# Patient Record
Sex: Female | Born: 1975 | Race: White | Hispanic: No | Marital: Married | Smoking: Never smoker
Health system: Southern US, Community
[De-identification: ages and names within clinical notes are randomized; demographics above are authoritative.]

## PROBLEM LIST (undated history)

## (undated) DIAGNOSIS — F909 Attention-deficit hyperactivity disorder, unspecified type: Secondary | ICD-10-CM

## (undated) DIAGNOSIS — L509 Urticaria, unspecified: Secondary | ICD-10-CM

## (undated) HISTORY — PX: APPENDECTOMY: SHX54

## (undated) HISTORY — DX: Urticaria, unspecified: L50.9

## (undated) HISTORY — DX: Attention-deficit hyperactivity disorder, unspecified type: F90.9

---

## 1998-10-09 ENCOUNTER — Other Ambulatory Visit: Admission: RE | Admit: 1998-10-09 | Discharge: 1998-10-09 | Payer: Self-pay | Admitting: Obstetrics and Gynecology

## 1998-11-24 ENCOUNTER — Encounter (INDEPENDENT_AMBULATORY_CARE_PROVIDER_SITE_OTHER): Payer: Self-pay | Admitting: Specialist

## 1998-11-24 ENCOUNTER — Ambulatory Visit (HOSPITAL_COMMUNITY): Admission: RE | Admit: 1998-11-24 | Discharge: 1998-11-24 | Payer: Self-pay | Admitting: Obstetrics and Gynecology

## 1999-12-06 ENCOUNTER — Other Ambulatory Visit: Admission: RE | Admit: 1999-12-06 | Discharge: 1999-12-06 | Payer: Self-pay | Admitting: Gynecology

## 1999-12-17 ENCOUNTER — Inpatient Hospital Stay (HOSPITAL_COMMUNITY): Admission: AD | Admit: 1999-12-17 | Discharge: 1999-12-17 | Payer: Self-pay | Admitting: Obstetrics and Gynecology

## 2000-09-23 ENCOUNTER — Inpatient Hospital Stay (HOSPITAL_COMMUNITY): Admission: AD | Admit: 2000-09-23 | Discharge: 2000-09-23 | Payer: Self-pay | Admitting: Obstetrics & Gynecology

## 2000-12-09 ENCOUNTER — Inpatient Hospital Stay (HOSPITAL_COMMUNITY): Admission: AD | Admit: 2000-12-09 | Discharge: 2000-12-09 | Payer: Self-pay | Admitting: Obstetrics & Gynecology

## 2007-11-23 ENCOUNTER — Inpatient Hospital Stay (HOSPITAL_COMMUNITY): Admission: AD | Admit: 2007-11-23 | Discharge: 2007-11-23 | Payer: Self-pay | Admitting: Obstetrics and Gynecology

## 2007-11-24 ENCOUNTER — Inpatient Hospital Stay (HOSPITAL_COMMUNITY): Admission: AD | Admit: 2007-11-24 | Discharge: 2007-11-24 | Payer: Self-pay | Admitting: Obstetrics and Gynecology

## 2007-11-28 ENCOUNTER — Ambulatory Visit (HOSPITAL_COMMUNITY): Admission: RE | Admit: 2007-11-28 | Discharge: 2007-11-28 | Payer: Self-pay | Admitting: Obstetrics and Gynecology

## 2007-12-05 ENCOUNTER — Ambulatory Visit (HOSPITAL_COMMUNITY): Admission: RE | Admit: 2007-12-05 | Discharge: 2007-12-05 | Payer: Self-pay | Admitting: Obstetrics and Gynecology

## 2008-01-29 ENCOUNTER — Inpatient Hospital Stay (HOSPITAL_COMMUNITY): Admission: AD | Admit: 2008-01-29 | Discharge: 2008-01-30 | Payer: Self-pay | Admitting: Obstetrics and Gynecology

## 2008-04-15 ENCOUNTER — Inpatient Hospital Stay (HOSPITAL_COMMUNITY): Admission: AD | Admit: 2008-04-15 | Discharge: 2008-04-15 | Payer: Self-pay | Admitting: Obstetrics and Gynecology

## 2008-07-18 ENCOUNTER — Inpatient Hospital Stay (HOSPITAL_COMMUNITY): Admission: AD | Admit: 2008-07-18 | Discharge: 2008-07-21 | Payer: Self-pay | Admitting: Obstetrics and Gynecology

## 2008-07-18 ENCOUNTER — Ambulatory Visit: Payer: Self-pay | Admitting: Advanced Practice Midwife

## 2010-09-14 LAB — CBC
Hemoglobin: 10.2 g/dL — ABNORMAL LOW (ref 12.0–15.0)
Hemoglobin: 11.6 g/dL — ABNORMAL LOW (ref 12.0–15.0)
MCV: 96.3 fL (ref 78.0–100.0)
RBC: 3.15 MIL/uL — ABNORMAL LOW (ref 3.87–5.11)
RBC: 3.6 MIL/uL — ABNORMAL LOW (ref 3.87–5.11)
RDW: 12.8 % (ref 11.5–15.5)
WBC: 10.8 10*3/uL — ABNORMAL HIGH (ref 4.0–10.5)
WBC: 13.3 10*3/uL — ABNORMAL HIGH (ref 4.0–10.5)

## 2010-10-12 NOTE — Discharge Summary (Signed)
Tammy Richard, Tammy Richard              ACCOUNT NO.:  [id number]   MEDICAL RECORD NO.:  [id number]          PATIENT TYPE:  INP   LOCATION:  9103                          [facility]ILITY[facility]   PHYSICIAN:  Gerrit Friends. Aldona Bar, M.D.   DATE OF BIRTH:  [birth year]   DATE OF ADMISSION:  07/18/2008  DATE OF DISCHARGE:  07/21/2008                               DISCHARGE SUMMARY   DISCHARGE DIAGNOSES:  1. Term pregnancy, delivered 8 pounds 5 ounces female infant, Apgars 9      and 9.  2. Blood type  .  3. Induction of labor.   PROCEDURES:  Normal spontaneous delivery.   SUMMARY:  This 35 year old gravida 5, para 2 was admitted at 33 weeks'  and 6 days for induction of labor by Dr. Dareen Piano.  She delivered a  normal spontaneous delivery on July 19, 2008 and subsequently had a  benign postpartum course.  Discharge hemoglobin 10.2, white count  13,300, and platelet count 138,000.  On the morning of July 21, 2008, she was ambulating well, tolerating a regular diet well, having  normal bowel and bladder function, was breast-feeding without  difficulty, and was desirous of discharge.  Accordingly, she was given  all appropriate instructions and understood all instructions well.   DISCHARGE MEDICATIONS:  1. Advil or Tylenol as needed.  2. She will continue her vitamins as long as she is breast feeding and      use Feosol capsules - one daily until return to the office in 4      weeks' time to see Dr. Dareen Piano.   At the time of discharge.  Baby was not circumcised and therefore was  circumcised immediately prior to discharge.   CONDITION ON DISCHARGE:  Improved.       Gerrit Friends. Aldona Bar, M.D.  Electronically Signed     RMW/MEDQ  D:  07/21/2008  T:  07/21/2008  Job:  [id number]

## 2010-10-15 NOTE — Op Note (Signed)
NAMEALYSHA, DOOLAN NO.:  [id number]   MEDICAL RECORD NO.:  [id number]          PATIENT TYPE:  INP   LOCATION:  9103                          [facility]   PHYSICIAN:  Cam Hai, C.N.M.  DATE OF BIRTH:  22-Sep-1975   DATE OF PROCEDURE:  07/23/2008  DATE OF DISCHARGE:  07/21/2008                               OPERATIVE REPORT   Ms. Solana is a 35 year old gravida 5, para 2 white female in active  labor on July 01, 2008.  She was progressing rapidly with the  possibility that her attending physician may not make the delivery,  therefore my presence was requested by the nurse caring for the patient.  Upon entering the room, the patient was crowning after a couple times of  uncontrollable pushing.  She progressed to normal spontaneous vaginal  birth of a viable female infant, Apgars 9 at 1 minute 9 at 5 minutes,  weight of 8 pounds and 5 ounces.  The infant was lifted to the patient's  abdomen.  Cord was clamped and cut by the father of the baby.  Cord  blood was collected.  Placenta delivered spontaneous and intact with a 3-  vessel cord.  The fundus was firm with normal lochia.  Perineal  inspection revealed no lacerations.  Estimated blood loss was less than  500 mL.  The patient and baby were stable upon my departure of the room.  At the request of Dr. Dareen Piano, these events were not recorded in the  patient's chart and therefore has been dictated.      Cam Hai, C.N.M.     KS/MEDQ  D:  07/23/2008  T:  07/24/2008  Job:  [id number]

## 2011-02-24 LAB — CBC
HCT: 40.5
MCHC: 34.1
MCV: 93.2
Platelets: 256
WBC: 15.5 — ABNORMAL HIGH

## 2011-02-24 LAB — URINE MICROSCOPIC-ADD ON

## 2011-02-24 LAB — URINALYSIS, ROUTINE W REFLEX MICROSCOPIC
Glucose, UA: NEGATIVE
Protein, ur: NEGATIVE
Specific Gravity, Urine: 1.025

## 2011-02-24 LAB — URINE CULTURE

## 2011-03-02 LAB — WET PREP, GENITAL: Trich, Wet Prep: NONE SEEN

## 2011-03-02 LAB — URINALYSIS, ROUTINE W REFLEX MICROSCOPIC
Leukocytes, UA: NEGATIVE
Nitrite: NEGATIVE
Protein, ur: NEGATIVE
Urobilinogen, UA: 0.2

## 2011-03-02 LAB — URINE MICROSCOPIC-ADD ON

## 2014-09-15 ENCOUNTER — Encounter (HOSPITAL_BASED_OUTPATIENT_CLINIC_OR_DEPARTMENT_OTHER): Payer: Self-pay

## 2014-09-15 ENCOUNTER — Emergency Department (HOSPITAL_BASED_OUTPATIENT_CLINIC_OR_DEPARTMENT_OTHER)
Admission: EM | Admit: 2014-09-15 | Discharge: 2014-09-15 | Disposition: A | Discharge status: Home / Self Care | Payer: Worker's Compensation | Attending: Emergency Medicine | Admitting: Emergency Medicine

## 2014-09-15 DIAGNOSIS — Y998 Other external cause status: Secondary | ICD-10-CM | POA: Diagnosis not present

## 2014-09-15 DIAGNOSIS — Y9389 Activity, other specified: Secondary | ICD-10-CM | POA: Insufficient documentation

## 2014-09-15 DIAGNOSIS — Y92410 Unspecified street and highway as the place of occurrence of the external cause: Secondary | ICD-10-CM | POA: Insufficient documentation

## 2014-09-15 DIAGNOSIS — Z880 Allergy status to penicillin: Secondary | ICD-10-CM | POA: Insufficient documentation

## 2014-09-15 DIAGNOSIS — M62838 Other muscle spasm: Secondary | ICD-10-CM | POA: Insufficient documentation

## 2014-09-15 DIAGNOSIS — S3992XA Unspecified injury of lower back, initial encounter: Secondary | ICD-10-CM | POA: Diagnosis present

## 2014-09-15 MED ORDER — PREDNISONE 50 MG PO TABS
60.0000 mg | ORAL_TABLET | Freq: Once | ORAL | Status: AC
  Administered 2014-09-15: 60 mg via ORAL
  Filled 2014-09-15 (×2): qty 1

## 2014-09-15 MED ORDER — METHOCARBAMOL 500 MG PO TABS: 1000.0000 mg | ORAL_TABLET | Freq: Four times a day (QID) | ORAL | Status: DC | PRN

## 2014-09-15 MED ORDER — METHOCARBAMOL 500 MG PO TABS
1000.0000 mg | ORAL_TABLET | Freq: Once | ORAL | Status: AC
  Administered 2014-09-15: 1000 mg via ORAL
  Filled 2014-09-15: qty 2

## 2014-09-15 MED ORDER — PREDNISONE 50 MG PO TABS: ORAL_TABLET | ORAL | Status: DC
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
## 2014-09-15 NOTE — Discharge Instructions (Signed)
You can also take  tylenol (acetaminophen)  (this is 3 over the counter pills) four times a day. Do not drink alcohol or combine with other medications that have acetaminophen as an ingredient (Read the labels!).    For breakthrough pain you may take Robaxin. Do not drink alcohol, drive or operate heavy machinery when taking Robaxin.  Do not hesitate to return to the emergency room for any new, worsening or concerning symptoms.  Please obtain primary care using resource guide below. But the minute you were seen in the emergency room and that they will need to obtain records for further outpatient management.   Muscle Cramps and Spasms Muscle cramps and spasms are when muscles tighten by themselves. They usually get better within minutes. Muscle cramps are painful. They are usually stronger and last longer than muscle spasms. Muscle spasms may or may not be painful. They can last a few seconds or much longer. HOME CARE  Drink enough fluid to keep your pee (urine) clear or pale yellow.  Massage, stretch, and relax the muscle.  Use a warm towel, heating pad, or warm shower water on tight muscles.  Place ice on the muscle if it is tender or in pain.  Put ice in a plastic bag.  Place a towel between your skin and the bag.  Leave the ice on for 15-20 minutes, 03-04 times a day.  Only take medicine as told by your doctor. GET HELP RIGHT AWAY IF:  Your cramps or spasms get worse, happen more often, or do not get better with time. MAKE SURE YOU:  Understand these instructions.  Will watch your condition.  Will get help right away if you are not doing well or get worse. Document Released: 04/28/2008 Document Revised: 09/10/2012 Document Reviewed: 05/02/2012 [facility] Patient Information 2015 [place], [place]. This information is not intended to replace advice given to you by your health care provider. Make sure you discuss any questions you have with your health care  provider.  Emergency Department Resource Guide 1) Find a Doctor and Pay Out of Pocket Although you won't have to find out who is covered by your insurance plan, it is a good idea to ask around and get recommendations. You will then need to call the office and see if the doctor you have chosen will accept you as a new patient and what types of options they offer for patients who are self-pay. Some doctors offer discounts or will set up payment plans for their patients who do not have insurance, but you will need to ask so you aren't surprised when you get to your appointment.  2) Contact Your Local Health Department Not all health departments have doctors that can see patients for sick visits, but many do, so it is worth a call to see if yours does. If you don't know where your local health department is, you can check in your phone book. The CDC also has a tool to help you locate your state's health department, and many state websites also have listings of all of their local health departments.  3) Find a Walk-in Clinic If your illness is not likely to be very severe or complicated, you may want to try a walk in clinic. These are popping up all over the country in pharmacies, drugstores, and shopping centers. They're usually staffed by nurse practitioners or physician assistants that have been trained to treat common illnesses and complaints. They're usually fairly quick and inexpensive. However, if you have serious medical  issues or chronic medical problems, these are probably not your best option.  No Primary Care Doctor: - Call Health Connect at  [phone] - they can help you locate a primary care doctor that  accepts your insurance, provides certain services, etc. - Physician Referral Service- [phone]  Chronic Pain Problems: Organization         Address  Phone   Notes  [facility]  [phone] Patients need to be referred by their primary care doctor.   Medication  Assistance: Organization         Address  Phone   Notes  [facility] Medication [facility] [address] [place] [place], [place] [address] [phone] --Must be a resident of [facility] -- Must have NO insurance coverage whatsoever (no Medicaid/ Medicare, etc.) -- The pt. MUST have a primary care doctor that directs their care regularly and follows them in the community   [facility]  [phone]   [employer]  [phone]    Agencies that provide inexpensive medical care: Organization         Address  Phone   Notes  [facility]  [phone]   [facility]    [phone]   [facility] [address] [place], [place] [address] [phone]   [facility] [address] [place] [phone]   Planned Parenthood    [phone]   [facility]    [phone]   [facility] and [facility]  [address], [place] Phone:  [phone], Fax:  [phone] Hours of Operation:  9 am - 6 pm, M-F.  Also accepts Medicaid/Medicare and self-pay.  [facility]  [address], [place] Phone: [phone], Fax: [phone]. Hours of Operation:  8:30 am - 5:30 pm, M-F.  Also accepts Medicaid and self-pay.  [facility] [place] [address] [place] Phone: [phone]   [facility] [address] Natasha Bence [place], [place] [phone], Ext. [phone] Mondays & Thursdays: 7-9 AM.  First 15 patients are seen on a first come, first serve basis.    Medicaid-accepting [facility] Providers:  Organization         Address  Phone   Notes  [facility] [address], [place] [phone] Also accepts self-pay patients.  [facility] [address] Laurell Josephs [place], [place]  [phone]   [facility] [address], [place]  [phone]   [facility] [address] [place] [phone]   Renaye Rakers [address], [place]   [phone] Only accepts [place] Access [place] patients after they have their name applied to their card.   Self-Pay (no insurance) in [facility]:  Organization         Address  Phone   Notes  Sickle Cell Patients, [facility] [address] [place], [place] [phone]   [facility] [address] [place], [place] [phone]   [facility] [place]  [address], [place] [phone]   [facility]/Dr. Osei-Bonsu  [address], [place] or [address], [place] [phone] Phone number for both [place] and [place] locations is the  same.  [facility] [address], [place] [phone]   [facility] [address], [place] or [address] [phone]   [facility] [address] [place] [phone], phone; [phone], fax Sees patients 1st and 3rd Saturday of every month.  Must not qualify for public or private insurance (i.e. Medicaid, Medicare, [place] Health Choice, Veterans' Benefits)  Household income should be no more than 200% of the poverty level The clinic cannot treat you if you are pregnant or think you are pregnant  Sexually transmitted diseases are not treated at the clinic.    Dental Care: Organization         Address  Phone  Notes  [facility] Department of [facility] [address] [place], [place] [phone] Accepts children up to age 58 who are enrolled in [place] or [place] Health Choice; pregnant women with a Medicaid card; and children who have applied for Medicaid or [place] Health Choice, but were declined, whose parents can pay a reduced fee at time of service.  [facility]  Department of [facility]  [address], [place] [phone] Accepts children up to age 12 who are enrolled in [place] or [place] Health Choice; pregnant women with a Medicaid card; and children who have applied for Medicaid or  Health Choice, but were declined, whose parents can pay a reduced fee at time of service.  [facility]  [address] [place], [place] [phone] Patients are seen by appointment only. Walk-ins are not accepted. [facility] will see patients 43 years of age and older. Monday - Tuesday (8am-5pm) Most Wednesdays (8:30-5pm) $30 per visit, cash only  [facility] Adult Dental Access PROGRAM  [address], [facility] [phone] Patients are seen by appointment only. Walk-ins are not accepted. [facility] will see patients 7 years of age and older. One Wednesday Evening (Monthly: Volunteer Based).  $30 per visit, cash only  [employer]  [phone] for adults; Children under age 6, call Graduate Pediatric Dentistry at [phone]. Children aged 14-14, please call [phone] to request a pediatric application.  Dental services are provided in all areas of dental care including fillings, crowns and bridges, complete and partial dentures, implants, gum treatment, root canals, and extractions. Preventive care is also provided. Treatment is provided to both adults and children. Patients are selected via a lottery and there is often a waiting list.   [facility] [address] [place]  [phone] [email or web address]   [facility] [address] [place], [place] [phone], Ext. [phone] Second and Fourth Thursday of each month, opens at 6:30 AM; Clinic ends at 9 AM.  Patients are seen on a first-come first-served basis, and a limited number are seen during each clinic.   [facility]  [address] Ether Griffins [place], [place] [phone]    Eligibility Requirements You must have lived in [place], [place], or [place] counties for at least the last three months.   You cannot be eligible for state or federal sponsored [place], including [employer], [place], or [employer].   You generally cannot be eligible for healthcare insurance through your employer.    How to apply: Eligibility screenings are held every Tuesday and Wednesday afternoon from 1:00 pm until 4:00 pm. You do not need an appointment for  the interview!  [facility] [address] [place], [place] [phone]   [facility] Health Department  [phone]   [facility] Health Department  [phone]   [facility] Health Department  [phone]    Behavioral Health Resources in the Community: Intensive Outpatient Programs Organization         Address  Phone  Notes  [facility] Services [address], [place], [place] [phone]   [facility] Outpatient [address], [place], [place] [phone]   [facility] [address] [place], [place]  [phone]   [facility] [address],  [place], [place] [phone] or [phone]   Substance Abuse Resources Organization         Address  Phone  Notes  [facility] Drug Services  [phone]   [facility]  [phone]   [facility] [place]  [phone]   Floydene Flock  [phone]   Residential & Outpatient Substance Abuse Program  3013960596   Psychological Services Organization         Address  Phone  Notes  [facility]  [phone]   [facility] Services  [phone]   [facility] Mental Health [address] [place] [phone] or [phone]    Mobile Crisis Teams Organization         Address  Phone  Notes  [facility], Mobile Crisis Care Unit  [phone]   [facility]  [address]  [place], [place] [phone]   Doristine Locks [address] [place] [phone]    Self-Help/Support Groups Organization         Address  Phone             Notes  [facility] [place] - variety of support groups  336- I7437963 Call for more information  Narcotics Anonymous (NA), [facility] [address], [employer] [place]  2 meetings at this location   Statistician         Address  Phone  Notes  [facility] [address],    [place]  [phone]   [facility]  [address], [place] [address], [place], [place] [phone]   [facility] [address] [place], [place] [phone] Admissions: 8am-3pm M-F  [facility] [address]    [place], [place] [phone]   [facility] [address] [place], [place], [place] [phone]   [facility] [address],  [place], [place] [phone]   [facility] - Intensive Outpatient [address] Laurell Joseph[address], [place], [place] [phone]   [facility] ([facility] [address] [place]  [place], [place] [phone] or 9067212322   [facility] ([facility]) [address] [place], [place] [phone] Accepts Medicaid  [facility] [address],  [place] [phone] Substance Abuse/Addiction Treatment   [facility] Organization         Address  Phone  Notes  [facility]  [phone]   Angie Fava, PhD [address] Ervin [place], [place]   [phone] or [phone]   [facility] Behavioral   [address] [place], [place] [phone]   [facility] [address], [place], [place] [phone] Insurance/Medicaid/sponsorship through [employer] and Families [address]  [place], [place] [phone](336) [phone] Therapy/tele-psych/case    [facility] [address]   [place], [place] [phone]) [phone]    Dr. Lolly MustacheArfeen  [phone](336) [phone]   [facility]  [facility] 1) [address], [place] 2) [address], [place] 3)  [address], [place] [phone]) [phone](336) [phone]  [phone](336) [phone]   [facility] Child Abuse Hotline [phone](336) [phone] or [phone](336) [phone] (After Hours)

## 2014-09-15 NOTE — ED Notes (Signed)
Patient preparing for discharge. 
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
## 2015-07-25 DIAGNOSIS — L508 Other urticaria: Secondary | ICD-10-CM | POA: Insufficient documentation

## 2015-07-26 DIAGNOSIS — F32A Depression, unspecified: Secondary | ICD-10-CM | POA: Insufficient documentation

## 2015-07-26 DIAGNOSIS — F329 Major depressive disorder, single episode, unspecified: Secondary | ICD-10-CM | POA: Insufficient documentation

## 2015-07-26 DIAGNOSIS — F909 Attention-deficit hyperactivity disorder, unspecified type: Secondary | ICD-10-CM | POA: Insufficient documentation

## 2015-07-26 DIAGNOSIS — E7800 Pure hypercholesterolemia, unspecified: Secondary | ICD-10-CM | POA: Insufficient documentation

## 2015-07-26 DIAGNOSIS — F419 Anxiety disorder, unspecified: Secondary | ICD-10-CM | POA: Insufficient documentation

## 2015-09-15 ENCOUNTER — Ambulatory Visit (INDEPENDENT_AMBULATORY_CARE_PROVIDER_SITE_OTHER): Payer: BLUE CROSS/BLUE SHIELD | Admitting: Pediatrics

## 2015-09-15 ENCOUNTER — Encounter: Payer: Self-pay | Admitting: Pediatrics

## 2015-09-15 VITALS — BP 110/70 | HR 96 | Temp 98.5°F | Resp 16 | Ht 66.14 in | Wt 136.9 lb

## 2015-09-15 DIAGNOSIS — Q603 Renal hypoplasia, unilateral: Secondary | ICD-10-CM | POA: Diagnosis not present

## 2015-09-15 DIAGNOSIS — L501 Idiopathic urticaria: Secondary | ICD-10-CM | POA: Diagnosis not present

## 2015-09-15 NOTE — Patient Instructions (Addendum)
Continue on Xolair 2 injections once a month Zyrtec 10 mg once a day if needed for itching Have  EpiPen 0.3 mg available in case of an allergic reaction to Xolair. In that case take Benadryl 50 mg every 4 hours also Call me if the hives are not under control

## 2015-09-15 NOTE — Progress Notes (Signed)
[address] [place] [address] Dept: [phone]  New Patient Note  Patient ID: Tammy Richard, female    DOB: [birth year]  Age: 40 y.o. MRN: [id number] Date of Office Visit: 09/15/2015 Referring provider: Bosie Clos, MD [address] [place], [place] [address]    Chief Complaint: Other and Urticaria  HPI Tammy Richard presents for Treatment of chronic urticaria for 5 years. One year ago she was started on Xolair 2 injections once a month and her hives have improved dramatically. She rarely rarely has to use Zyrtec 10 mg once a day. She has been seen by Dr. Tenny Richard  In [place] but now , she wants to get her injections here in [facility].. She has been approved for Xolair injections for the next 12 months  Review of Systems  Constitutional: Negative.   HENT: Negative.   Eyes: Negative.   Respiratory: Negative.   Cardiovascular: Negative.   Gastrointestinal: Negative.   Genitourinary:       One  undeveloped kidney  Musculoskeletal: Negative.   Skin:       Hives for 6 years. No hives in the past year while on Xolair  Neurological: Negative.   Endo/Heme/Allergies: Negative.   Psychiatric/Behavioral: Negative.     Outpatient Encounter Prescriptions as of 09/15/2015  Medication Sig  . Amphetamine-Dextroamphetamine (ADDERALL PO) Take by mouth.  . EPINEPHrine (EPIPEN 2-PAK) 0.3 mg/0.3 mL IJ SOAJ injection Inject into the muscle once.  Marland Kitchen omalizumab (XOLAIR) 150 MG injection Inject 300 mg into the skin.  . valACYclovir (VALTREX) 1000 MG tablet   . methocarbamol (ROBAXIN) 500 MG tablet Take 2 tablets (1,000 mg total) by mouth 4 (four) times daily as needed (Pain). (Patient not taking: Reported on 09/15/2015)  . predniSONE (DELTASONE) 50 MG tablet Take 1 tablet daily with breakfast (Patient not taking: Reported on 09/15/2015)   No facility-administered encounter medications on file as of 09/15/2015.     Drug Allergies:  Allergies  Allergen Reactions  . Codeine     . Ibuprofen   . Penicillins   . Sulfa Antibiotics     Family History: Tammy Richard's family history includes COPD in her mother; Emphysema in her mother. There is no history of Allergic rhinitis, Angioedema, Asthma, Eczema, Immunodeficiency, or Urticaria..No lupus  Social and environmental. There is a dog in the home. She has never smoked cigarettes. Her symptoms are not worse at work.  Physical Exam: BP 110/70 mmHg  Pulse 96  Temp(Src) 98.5 F (36.9 C) (Oral)  Resp 16  Ht 5' 6.14" (1.68 m)  Wt 136 lb 14.5 oz (62.1 kg)  BMI 22.00 kg/m2   Physical Exam  Constitutional: She is oriented to person, place, and time. She appears well-developed and well-nourished.  HENT:  Eyes normal. Ears normal. Nose normal. Pharynx normal.  Neck: Neck supple. No thyromegaly present.  Cardiovascular:  S 1  S2 normal no murmurs  Pulmonary/Chest:  Clear to percussion auscultation  Abdominal: Soft. There is no tenderness (no hepatosplenomegaly).  Lymphadenopathy:    She has no cervical adenopathy.  Neurological: She is alert and oriented to person, place, and time.  Skin:  Clear  Psychiatric: She has a normal mood and affect. Her behavior is normal. Judgment and thought content normal.  Vitals reviewed.   Diagnostics:  none  Assessment Assessment and Plan: 1. Idiopathic urticaria   2. Hypoplasia of one kidney         Patient Instructions  Continue on Xolair 2 injections once a month Zyrtec  10 mg once a day if needed for itching Have  EpiPen 0.3 mg available in case of an allergic reaction to Xolair. In that case take Benadryl 50 mg every 4 hours also Call me if the hives are not under control    Return in about 1 year (around 09/14/2016).   Thank you for the opportunity to care for this patient.  Please do not hesitate to contact me with questions.  Tammy Richard, M.D.  [facility] [address] [place], [place] [address] [phone]  [phone]

## 2015-09-16 ENCOUNTER — Other Ambulatory Visit: Payer: Self-pay | Admitting: *Deleted

## 2015-09-16 MED ORDER — OMALIZUMAB 150 MG ~~LOC~~ SOLR: 300.0000 mg | SUBCUTANEOUS | Status: DC

## 2015-09-16 NOTE — Telephone Encounter (Signed)
[facility] TRANSFERRING RX FROM DR ROSS TO OUR CLINIC.  RX FAXED TO [facility], I HAVE ALREADY TALKED TO MICHELLE AT [facility] ADVISED THEY WERE CONTACTING PT RE: NEW RX FROM DR ROSS OFFICE BUT ADVISED THAT DR ROSS OFFICE TOLD THEM NOT TO SEND THAT PT WAS LEAVING PRACTICE OR D/C THERAPY. PER MICHELLE SHE WILL D/C THIS RX AND I FAXED NEW RX TO HER TO TODAY AND L/M FOR PATIENT TO CALL ME TO CONTACT [facility] FOR DELIVERY TO OUR OFFICE

## 2015-11-02 ENCOUNTER — Ambulatory Visit (INDEPENDENT_AMBULATORY_CARE_PROVIDER_SITE_OTHER): Payer: BLUE CROSS/BLUE SHIELD

## 2015-11-02 DIAGNOSIS — L500 Allergic urticaria: Secondary | ICD-10-CM | POA: Diagnosis not present

## 2015-11-02 MED ORDER — OMALIZUMAB 150 MG ~~LOC~~ SOLR
300.0000 mg | SUBCUTANEOUS | Status: AC
  Administered 2015-11-02 – 2018-05-16 (×24): 300 mg via SUBCUTANEOUS

## 2015-12-02 ENCOUNTER — Ambulatory Visit (INDEPENDENT_AMBULATORY_CARE_PROVIDER_SITE_OTHER): Payer: BLUE CROSS/BLUE SHIELD

## 2015-12-02 DIAGNOSIS — L501 Idiopathic urticaria: Secondary | ICD-10-CM | POA: Diagnosis not present

## 2015-12-30 ENCOUNTER — Ambulatory Visit (INDEPENDENT_AMBULATORY_CARE_PROVIDER_SITE_OTHER): Payer: BLUE CROSS/BLUE SHIELD

## 2015-12-30 DIAGNOSIS — L500 Allergic urticaria: Secondary | ICD-10-CM | POA: Diagnosis not present

## 2016-01-27 ENCOUNTER — Ambulatory Visit (INDEPENDENT_AMBULATORY_CARE_PROVIDER_SITE_OTHER): Payer: BLUE CROSS/BLUE SHIELD

## 2016-01-27 DIAGNOSIS — L500 Allergic urticaria: Secondary | ICD-10-CM | POA: Diagnosis not present

## 2016-02-24 ENCOUNTER — Ambulatory Visit (INDEPENDENT_AMBULATORY_CARE_PROVIDER_SITE_OTHER): Payer: BLUE CROSS/BLUE SHIELD

## 2016-02-24 ENCOUNTER — Ambulatory Visit: Payer: BLUE CROSS/BLUE SHIELD

## 2016-02-24 DIAGNOSIS — L500 Allergic urticaria: Secondary | ICD-10-CM

## 2016-03-23 ENCOUNTER — Ambulatory Visit (INDEPENDENT_AMBULATORY_CARE_PROVIDER_SITE_OTHER): Payer: BLUE CROSS/BLUE SHIELD

## 2016-03-23 DIAGNOSIS — L500 Allergic urticaria: Secondary | ICD-10-CM

## 2016-04-19 ENCOUNTER — Telehealth: Payer: Self-pay | Admitting: *Deleted

## 2016-04-19 MED ORDER — HYDROXYZINE HCL 25 MG PO TABS: 25.0000 mg | ORAL_TABLET | Freq: Three times a day (TID) | ORAL | 0 refills | Status: DC | PRN

## 2016-04-19 MED ORDER — HYDROCORTISONE 1 % EX CREA: TOPICAL_CREAM | CUTANEOUS | 0 refills | Status: DC

## 2016-04-19 MED ORDER — TRIAMCINOLONE ACETONIDE 0.1 % EX CREA: TOPICAL_CREAM | CUTANEOUS | 0 refills | Status: DC

## 2016-04-19 NOTE — Telephone Encounter (Signed)
Call in   hydroxyzine 25 mg tablet. Take 1 tablet 3 times a day if needed for itching. Also call in  Triamcinolone 0.1% cream twice a day if needed to red itchy areas below the face. On the face she may use 1% hydrocortisone cream twice a day if needed to red itchy areas

## 2016-04-19 NOTE — Telephone Encounter (Signed)
Pt is wondering if we can call in atarax and triamcinolone cream. Pharmacy is [facility] [place]

## 2016-04-19 NOTE — Telephone Encounter (Signed)
Dr. Beaulah DinningBardelas,  I do not see Atarax or triamcinolone in patients chart.  Will you authorize these?  Need dosage and directions. [facility] [place]. Please advise.

## 2016-04-20 ENCOUNTER — Ambulatory Visit (INDEPENDENT_AMBULATORY_CARE_PROVIDER_SITE_OTHER): Payer: BLUE CROSS/BLUE SHIELD | Admitting: *Deleted

## 2016-04-20 ENCOUNTER — Encounter: Payer: Self-pay | Admitting: *Deleted

## 2016-04-20 DIAGNOSIS — L500 Allergic urticaria: Secondary | ICD-10-CM

## 2016-05-18 ENCOUNTER — Ambulatory Visit (INDEPENDENT_AMBULATORY_CARE_PROVIDER_SITE_OTHER): Payer: BLUE CROSS/BLUE SHIELD | Admitting: *Deleted

## 2016-05-18 DIAGNOSIS — L500 Allergic urticaria: Secondary | ICD-10-CM

## 2016-06-15 ENCOUNTER — Ambulatory Visit: Payer: BLUE CROSS/BLUE SHIELD

## 2016-06-23 ENCOUNTER — Ambulatory Visit (INDEPENDENT_AMBULATORY_CARE_PROVIDER_SITE_OTHER): Payer: 59

## 2016-06-23 DIAGNOSIS — L500 Allergic urticaria: Secondary | ICD-10-CM | POA: Diagnosis not present

## 2016-07-21 ENCOUNTER — Ambulatory Visit (INDEPENDENT_AMBULATORY_CARE_PROVIDER_SITE_OTHER): Payer: 59 | Admitting: *Deleted

## 2016-07-21 DIAGNOSIS — L500 Allergic urticaria: Secondary | ICD-10-CM

## 2016-08-18 ENCOUNTER — Ambulatory Visit (INDEPENDENT_AMBULATORY_CARE_PROVIDER_SITE_OTHER): Payer: 59 | Admitting: *Deleted

## 2016-08-18 DIAGNOSIS — L500 Allergic urticaria: Secondary | ICD-10-CM | POA: Diagnosis not present

## 2016-09-15 ENCOUNTER — Ambulatory Visit (INDEPENDENT_AMBULATORY_CARE_PROVIDER_SITE_OTHER): Payer: 59

## 2016-09-15 DIAGNOSIS — L500 Allergic urticaria: Secondary | ICD-10-CM

## 2016-10-07 ENCOUNTER — Ambulatory Visit (INDEPENDENT_AMBULATORY_CARE_PROVIDER_SITE_OTHER): Payer: 59

## 2016-10-07 DIAGNOSIS — L500 Allergic urticaria: Secondary | ICD-10-CM | POA: Diagnosis not present

## 2016-10-20 ENCOUNTER — Telehealth: Payer: Self-pay | Admitting: *Deleted

## 2016-10-20 NOTE — Telephone Encounter (Signed)
Patient had change of Insurance the first of year to [employer].  I had to do appeal due to denial for Xolair so I advised the [facility] office to give sample for Feb. And March due to the Ins issue.  [facility]  I called and L/M for patient on 4/19 to get copay card and contact pharmacy.  [facility] has tried multiple times to contact patient with no response. I again left message on 09/27/16 for patient to contact pharmacy. At that time I put a note in her Xolair appt in [facility] for 5/11 to not give sample since patient has RX ready for delivery but was given sample again.  I called patient on 5/15 [facility] still no response and advised her to contact me and that our office will no longer administer samples for her. [place] [facility] has been advised that no samples to patient anymore.

## 2016-11-04 ENCOUNTER — Ambulatory Visit: Payer: Self-pay

## 2016-12-05 ENCOUNTER — Ambulatory Visit (INDEPENDENT_AMBULATORY_CARE_PROVIDER_SITE_OTHER): Payer: Self-pay

## 2016-12-05 DIAGNOSIS — L500 Allergic urticaria: Secondary | ICD-10-CM

## 2017-01-02 ENCOUNTER — Ambulatory Visit: Payer: Self-pay

## 2017-02-21 ENCOUNTER — Telehealth: Payer: Self-pay | Admitting: *Deleted

## 2017-02-21 NOTE — Telephone Encounter (Signed)
Patient husband Kathlene November called back and advised he found patient Xolair card with $10,000 on it and wanted to [facility] why we were not using that to get his wife her Xolair.  I tried to explain to him that the copay card is for copay used with commercial insurance that it is not for free drug.  He told me I was not listening to him that we should be using that card because none of it had been used.  I again tried to explain that it is not for the total cost of drug but just copay after insurance pays.  He become more agitated and wanted to know what he could do to use the card and I advised him that you have to have insurance to even qualify or use the copay card and patient husband hung up on me.At this time if the patient would like to discuss her Xolair she can contact me directly and not her husband

## 2017-02-21 NOTE — Telephone Encounter (Signed)
Patient husband called regarding patient getting Xolair. Advised his wife no longer has insurance and wanted to know cash price for drug he advised she needs badly. I advised him over $2000 for drug but we can try to get Xolair through [employer].  He advised his wife unable to contact me during day due to work so I will mail info and paperwork to get approval for free drug

## 2017-03-31 ENCOUNTER — Telehealth: Payer: Self-pay | Admitting: *Deleted

## 2017-03-31 NOTE — Telephone Encounter (Signed)
Patient had called me last week to advise she now has insurance and emailed me her card.  I did attempt [place] for Xolair for urticaria but [employer] denied requesting documentation that she is currently taking antihistamine with Xolair, still having clinical benefits from Xolair, or have tried to decrease dosage.  I called and left message for patient that she will need appt to [facility] and can contact me if she has any questions

## 2017-04-07 ENCOUNTER — Ambulatory Visit: Payer: Managed Care, Other (non HMO) | Admitting: Allergy & Immunology

## 2017-04-17 ENCOUNTER — Ambulatory Visit: Payer: Managed Care, Other (non HMO) | Admitting: Family Medicine

## 2017-04-24 ENCOUNTER — Encounter: Payer: Self-pay | Admitting: Family Medicine

## 2017-04-24 ENCOUNTER — Ambulatory Visit: Payer: Commercial Managed Care - PPO | Admitting: Family Medicine

## 2017-04-24 VITALS — BP 104/66 | HR 87 | Temp 98.3°F | Resp 16 | Ht 66.0 in | Wt 149.0 lb

## 2017-04-24 DIAGNOSIS — F909 Attention-deficit hyperactivity disorder, unspecified type: Secondary | ICD-10-CM

## 2017-04-24 DIAGNOSIS — L508 Other urticaria: Secondary | ICD-10-CM

## 2017-04-24 MED ORDER — RANITIDINE HCL 150 MG PO TABS: 150.0000 mg | ORAL_TABLET | Freq: Two times a day (BID) | ORAL | 5 refills | Status: DC

## 2017-04-24 MED ORDER — HYDROXYZINE HCL 25 MG PO TABS: 25.0000 mg | ORAL_TABLET | Freq: Three times a day (TID) | ORAL | 5 refills | Status: DC | PRN

## 2017-04-24 MED ORDER — TRIAMCINOLONE ACETONIDE 0.1 % EX CREA: TOPICAL_CREAM | CUTANEOUS | 3 refills | Status: DC

## 2017-04-24 NOTE — Progress Notes (Signed)
[address] [place] [address] Dept: [phone]  FAMILY NURSE PRACTITIONER FOLLOW UP NOTE  Patient ID: Tammy Richard, female    DOB: 06/09/1975  Age: 41 y.o. MRN: [id number] Date of Office Visit: 04/24/2017  Assessment  Chief Complaint: Urticaria  HPI Tammy Richard is a 41 year old female who presents to the clinic today for a follow-up  evaluation of chronic urticaria. She was last seen in the clinic on 09/15/2015 by Dr. Beaulah Richard for evaluation of urticaria that began 5 years prior to that visit. She was continued on Xolair injections at that visit.   At today's visit, she reports her urticaria is not well controlled. Due to a lapse in insurance, she has not received a Xolair injection since June 2018. She is currently taking Zyrtec 10 mg every morning and hydroxyzine 25 mg every night. She reports she also uses triamcinolone as needed and has a daily moisturizing routine. She repots some aggravating factors include hot water, cold water, sunshine, and pressure. The hives appear mainly on her arms, legs, chest, and occasionally on her back. She reports frequently waking up at night due to itching and her sheets are bloody several times a week. She reports having fewer and less severe flare-ups while receiving Xolair injections. Her chronic urticaria is much worse since she stopped her Xolair injections despite taking daily medications.  Current medications are listed in the chart.    Drug Allergies:  Allergies  Allergen Reactions  . Codeine   . Ibuprofen   . Penicillins   . Sulfa Antibiotics     Physical Exam: BP 104/66   Pulse 87   Temp 98.3 F (36.8 C) (Oral)   Resp 16   Ht 5\' 6"  (1.676 m)   Wt 149 lb (67.6 kg)   SpO2 97%   BMI 24.05 kg/m    Physical Exam  Constitutional: She is oriented to person, place, and time. She appears well-developed and well-nourished.  HENT:  Right Ear: External ear normal.  Left Ear: External ear normal.  Nose normal.  Ears normal. Pharynx normal. Eyes normal.  Eyes: Conjunctivae are normal.  Neck: Normal range of motion. Neck supple.  Cardiovascular:  S1-S2 normal. Regular heart rate and rhythm  Pulmonary/Chest: Effort normal and breath sounds normal.  Lungs clear to auscultation  Musculoskeletal: Normal range of motion.  Neurological: She is alert and oriented to person, place, and time.  Skin: Skin is warm and dry.  Fine red raised papular rash on bilateral arms and bilateral legs at today's exam.  Psychiatric: She has a normal mood and affect. Her behavior is normal. Judgment and thought content normal.     Assessment and Plan: 1. Chronic urticaria   2. Attention deficit hyperactivity disorder (ADHD), unspecified ADHD type     Meds ordered this encounter  Medications  . hydrOXYzine (ATARAX/VISTARIL) 25 MG tablet    Sig: Take 1 tablet (25 mg total) by mouth 3 (three) times daily as needed for itching.    Dispense:  90 tablet    Refill:  5  . triamcinolone cream (KENALOG) 0.1 %    Sig: Apply twice a day if needed to red itchy areas below the face.    Dispense:  45 g    Refill:  3  . ranitidine (ZANTAC) 150 MG tablet    Sig: Take 1 tablet (150 mg total) by mouth 2 (two) times daily.    Dispense:  60 tablet    Refill:  5  Patient Instructions  Continue Zyrtec 10 mg every morning Continue hydroxyzine 25 mg at night as needed for itching Begin ranitidine 150 mg twice a day Resume Xolair, 2 injections once a month Have EpiPen 0.3 mg available in case of an allergic reaction to Xolair. In that case take Benadryl 50 mg every 4 hours as well. Call me if this treatment plan is not working. Continue other medications as outlined in the chart. Follow-up in 6 months    Return in about 6 months (around 10/22/2017), or if symptoms worsen or fail to improve.   Tammy Richard was seen with Dr. Beaulah Richard in the clinic today.   Thank you for the opportunity to care for this patient.  Please do not  hesitate to contact me with questions.  Tammy LeylandAnne Paulena Servais, FNP [facility]

## 2017-04-24 NOTE — Patient Instructions (Addendum)
Continue Zyrtec 10 mg every morning Continue hydroxyzine 25 mg at night as needed for itching Begin ranitidine 150 mg twice a day Resume Xolair, 2 injections once a month Have EpiPen 0.3 mg available in case of an allergic reaction to Xolair. In that case take Benadryl 50 mg every 4 hours as well. Call me if this treatment plan is not working. Continue other medications as outlined in the chart. Follow-up in 6 months

## 2017-07-24 ENCOUNTER — Ambulatory Visit (INDEPENDENT_AMBULATORY_CARE_PROVIDER_SITE_OTHER): Payer: Managed Care, Other (non HMO)

## 2017-07-24 DIAGNOSIS — L501 Idiopathic urticaria: Secondary | ICD-10-CM

## 2017-08-21 ENCOUNTER — Ambulatory Visit: Payer: Self-pay

## 2017-09-04 ENCOUNTER — Ambulatory Visit: Payer: Managed Care, Other (non HMO)

## 2017-09-06 ENCOUNTER — Ambulatory Visit: Payer: Managed Care, Other (non HMO)

## 2017-09-07 ENCOUNTER — Ambulatory Visit: Payer: Managed Care, Other (non HMO)

## 2017-09-11 ENCOUNTER — Ambulatory Visit (INDEPENDENT_AMBULATORY_CARE_PROVIDER_SITE_OTHER): Payer: Managed Care, Other (non HMO) | Admitting: *Deleted

## 2017-09-11 DIAGNOSIS — L500 Allergic urticaria: Secondary | ICD-10-CM

## 2017-09-13 ENCOUNTER — Ambulatory Visit: Payer: Managed Care, Other (non HMO)

## 2017-10-09 ENCOUNTER — Ambulatory Visit (INDEPENDENT_AMBULATORY_CARE_PROVIDER_SITE_OTHER): Payer: Managed Care, Other (non HMO)

## 2017-10-09 DIAGNOSIS — L500 Allergic urticaria: Secondary | ICD-10-CM

## 2017-11-06 ENCOUNTER — Ambulatory Visit (INDEPENDENT_AMBULATORY_CARE_PROVIDER_SITE_OTHER): Payer: Managed Care, Other (non HMO)

## 2017-11-06 DIAGNOSIS — L500 Allergic urticaria: Secondary | ICD-10-CM

## 2017-12-04 ENCOUNTER — Ambulatory Visit (INDEPENDENT_AMBULATORY_CARE_PROVIDER_SITE_OTHER): Payer: Managed Care, Other (non HMO)

## 2017-12-04 DIAGNOSIS — L500 Allergic urticaria: Secondary | ICD-10-CM

## 2018-01-01 ENCOUNTER — Ambulatory Visit (INDEPENDENT_AMBULATORY_CARE_PROVIDER_SITE_OTHER): Payer: Managed Care, Other (non HMO) | Admitting: *Deleted

## 2018-01-01 DIAGNOSIS — L508 Other urticaria: Secondary | ICD-10-CM

## 2018-01-01 DIAGNOSIS — L501 Idiopathic urticaria: Secondary | ICD-10-CM

## 2018-01-31 ENCOUNTER — Ambulatory Visit (INDEPENDENT_AMBULATORY_CARE_PROVIDER_SITE_OTHER): Payer: Managed Care, Other (non HMO)

## 2018-01-31 DIAGNOSIS — L501 Idiopathic urticaria: Secondary | ICD-10-CM | POA: Diagnosis not present

## 2018-02-28 ENCOUNTER — Ambulatory Visit: Payer: Self-pay

## 2018-03-14 ENCOUNTER — Ambulatory Visit (INDEPENDENT_AMBULATORY_CARE_PROVIDER_SITE_OTHER): Payer: Managed Care, Other (non HMO) | Admitting: *Deleted

## 2018-03-14 DIAGNOSIS — L500 Allergic urticaria: Secondary | ICD-10-CM

## 2018-04-11 ENCOUNTER — Ambulatory Visit (INDEPENDENT_AMBULATORY_CARE_PROVIDER_SITE_OTHER): Payer: Managed Care, Other (non HMO) | Admitting: *Deleted

## 2018-04-11 DIAGNOSIS — L500 Allergic urticaria: Secondary | ICD-10-CM | POA: Diagnosis not present

## 2018-05-09 ENCOUNTER — Ambulatory Visit: Payer: Managed Care, Other (non HMO)

## 2018-05-09 ENCOUNTER — Ambulatory Visit: Payer: Self-pay

## 2018-05-09 ENCOUNTER — Ambulatory Visit: Payer: Managed Care, Other (non HMO) | Admitting: Family Medicine

## 2018-05-10 ENCOUNTER — Ambulatory Visit: Payer: Managed Care, Other (non HMO) | Admitting: Family Medicine

## 2018-05-10 ENCOUNTER — Ambulatory Visit: Payer: Managed Care, Other (non HMO)

## 2018-05-10 ENCOUNTER — Encounter: Payer: Self-pay | Admitting: Family Medicine

## 2018-05-10 VITALS — BP 110/60 | HR 88 | Temp 98.7°F | Resp 18 | Ht 65.87 in | Wt 149.2 lb

## 2018-05-10 DIAGNOSIS — L2084 Intrinsic (allergic) eczema: Secondary | ICD-10-CM

## 2018-05-10 DIAGNOSIS — L508 Other urticaria: Secondary | ICD-10-CM

## 2018-05-10 MED ORDER — TRIAMCINOLONE ACETONIDE 0.1 % EX CREA: TOPICAL_CREAM | CUTANEOUS | 3 refills | Status: DC

## 2018-05-10 MED ORDER — EPINEPHRINE 0.3 MG/0.3ML IJ SOAJ: INTRAMUSCULAR | 2 refills | Status: DC

## 2018-05-10 NOTE — Progress Notes (Addendum)
[address] [place] [address] Dept: [phone]  FOLLOW UP NOTE  Patient ID: Tammy Richard, female    DOB: 1975/11/16  Age: 42 y.o. MRN: [id number] Date of Office Visit: 05/10/2018  Assessment  Chief Complaint: Urticaria (xolair helps)  HPI Tammy Richard is a 42 year old female who presents to the clinic for a follow up. She was last seen in this clinic on 04/24/2017 by Thermon Leyland, NP for evaluation of allergic urticaria. She reports her urticaria as well controlled with Xolair once a month and cetirizine about 2 times a week. She reports eczema as moderately well controlled with a daily moisturizer and triamcinolone cream as needed to red and itchy areas which mostly occur on her elbows and ankles. Her current medications are listed in the chart.    Drug Allergies:  Allergies  Allergen Reactions  . Codeine   . Ibuprofen   . Penicillins   . Sulfa Antibiotics Rash    Physical Exam: BP 110/60 (BP Location: Right Arm, Patient Position: Sitting, Cuff Size: Normal)   Pulse 88   Temp 98.7 F (37.1 C) (Oral)   Resp 18   Ht 5' 5.87" (1.673 m)   Wt 149 lb 3.2 oz (67.7 kg)   SpO2 99%   BMI 24.18 kg/m    Physical Exam Constitutional:      Appearance: Normal appearance. She is normal weight.  HENT:     Head: Normocephalic.     Right Ear: Tympanic membrane normal.     Left Ear: Tympanic membrane normal.     Nose: Nose normal.     Mouth/Throat:     Mouth: Mucous membranes are moist.     Pharynx: Oropharynx is clear.  Eyes:     Conjunctiva/sclera: Conjunctivae normal.  Neck:     Musculoskeletal: Normal range of motion and neck supple.  Cardiovascular:     Rate and Rhythm: Normal rate and regular rhythm.     Pulses: Normal pulses.     Heart sounds: Normal heart sounds.     Comments: No murmur noted Pulmonary:     Effort: Pulmonary effort is normal.     Breath sounds: Normal breath sounds.     Comments: Lungs clear to auscultation Skin:    General: Skin  is warm and dry.     Comments: Inner aspect of right ankle with scattered red dry areas. No open areas or drainage noted.  Neurological:     Mental Status: She is alert and oriented to person, place, and time.  Psychiatric:        Mood and Affect: Mood normal.        Behavior: Behavior normal.        Thought Content: Thought content normal.        Judgment: Judgment normal.     Assessment and Plan: 1. Chronic urticaria   2. Intrinsic atopic dermatitis     Meds ordered this encounter  Medications  . DISCONTD: triamcinolone cream (KENALOG) 0.1 %    Sig: Apply twice a day if needed to red itchy areas below the face.    Dispense:  45 g    Refill:  3  . triamcinolone cream (KENALOG) 0.1 %    Sig: Apply twice a day if needed to red itchy areas below the face.    Dispense:  45 g    Refill:  3  . EPINEPHrine (AUVI-Q) 0.3 mg/0.3 mL IJ SOAJ injection    Sig: Use as directed  for severe allergic reaction.    Dispense:  2 Device    Refill:  2    Dispense one carton of 2 devices each plus trainer.    Patient Instructions   Urticaria Continue Zyrtec 10 mg every morning Continue hydroxyzine 25 mg at night as needed for itching Stop ranitidine. Begin famotidine 20 mg twice a day as needed for itching Continue Xolair injections once a month In case of an allergic reaction, take Benadryl 50 mg every 4 hours, and if life-threatening symptoms occur, inject with EpiPen 0.3 mg.  Eczema Continue a daily moisturizing routine Apply triamcinolone cream 1% twice a day as needed to red and itchy areas  Call me if this treatment plan is not working well for you Continue other medications as outlined in the chart.  Follow-up in 1 year or sooner if needed     Return in about 1 year (around 05/11/2019), or if symptoms worsen or fail to improve.    Thank you for the opportunity to care for this patient.  Please do not hesitate to contact me with questions.  Thermon LeylandAnne Betsi Crespi, FNP [facility]  _________________________________________________  I have provided oversight concerning [name]'s evaluation and treatment of this patient's health issues addressed during today's encounter.  I agree with the assessment and therapeutic plan as outlined in the note.   Signed,   R Jorene Guestarter Bobbitt, MD

## 2018-05-10 NOTE — Patient Instructions (Addendum)
  Urticaria Continue Zyrtec 10 mg every morning Continue hydroxyzine 25 mg at night as needed for itching Stop ranitidine. Begin famotidine 20 mg twice a day as needed for itching Continue Xolair injections once a month In case of an allergic reaction, take Benadryl 50 mg every 4 hours, and if life-threatening symptoms occur, inject with EpiPen 0.3 mg.  Eczema Continue a daily moisturizing routine Apply triamcinolone cream 1% twice a day as needed to red and itchy areas  Call me if this treatment plan is not working well for you Continue other medications as outlined in the chart.  Follow-up in 1 year or sooner if needed

## 2018-05-15 ENCOUNTER — Telehealth: Payer: Self-pay | Admitting: Allergy

## 2018-05-15 NOTE — Telephone Encounter (Signed)
Left message for patient to call office about phone number of [facility]. [phone]

## 2018-05-16 ENCOUNTER — Ambulatory Visit (INDEPENDENT_AMBULATORY_CARE_PROVIDER_SITE_OTHER): Payer: Managed Care, Other (non HMO) | Admitting: *Deleted

## 2018-05-16 DIAGNOSIS — L500 Allergic urticaria: Secondary | ICD-10-CM | POA: Diagnosis not present

## 2018-06-13 ENCOUNTER — Ambulatory Visit: Payer: Self-pay

## 2019-04-17 ENCOUNTER — Ambulatory Visit: Payer: Self-pay | Admitting: Family Medicine

## 2019-10-13 NOTE — Progress Notes (Signed)
Cardiology Office Note   Date:  10/14/2019   ID:  XARIA JUDON, DOB [birth year], MRN [id number]  PCP:  Bosie Clos, MD  Cardiologist:   No primary care provider on file. Referring:  Bosie Clos, MD  No chief complaint on file.     History of Present Illness: Tammy Richard is a 44 y.o. female who presents for a second opinion of tachycardia.  She is referred by Bosie Clos, MD  I did review extensive records from [facility].  She had an echocardiogram in February.  This demonstrated normal left ventricular function.  There were no valvular abnormalities.  Holter monitor demonstrated rare single PVCs.  There were total of 14 only.  There were no other arrhythmias.  She had labs that included normal thyroids.  She was diagnosed with sinus tachycardia thought to be related to anxiety but was seeking a second opinion on this.  Of note I did get to review the entire Holter.  She did not really have been normal nighttime heart rate variation that I could see.  Heart rate even at rest stayed in the 100 range.    She reports her symptoms started in December after she got Covid.  She was not hospitalized but she was sick with body aches, fevers and cough.  She had loss of taste and smell.  She did not however have severe shortness of breath.  She said that prior to this even on her stimulant her heart rate was always in the 80s.  She came off of all her medications with Covid and a monitor above was without her stimulant.  She said that since then her heart rate has been consistently in the 100s to sometimes 190s.  It will go up with minimal activity.  She is not describing severe orthostatic symptoms.  When it does go up she feels lightheaded.  She feels her lips fingers toes tingling.  She feels sometimes it is not like a rapid rate but it is also a strong heart rate.  It is pounding.  She is not syncope.  It does wake her at night.   Past Medical History:  Diagnosis Date    . ADHD (attention deficit hyperactivity disorder)   . Urticaria     Past Surgical History:  Procedure Laterality Date  . APPENDECTOMY       Current Outpatient Medications  Medication Sig Dispense Refill  . amphetamine-dextroamphetamine (ADDERALL XR) 20 MG 24 hr capsule Take by mouth.    . Cetirizine HCl 10 MG CAPS Take by mouth.    . diphenhydrAMINE (BENADRYL) 12.5 MG chewable tablet Chew by mouth.    . EPINEPHrine (AUVI-Q) 0.3 mg/0.3 mL IJ SOAJ injection Use as directed for severe allergic reaction. 2 Device 2  . EPINEPHrine (EPIPEN 2-PAK) 0.3 mg/0.3 mL IJ SOAJ injection Inject into the muscle once.    . hydrocortisone cream 1 % Apply twice a day to red itchy areas on face. 30 g 0  . hydrOXYzine (ATARAX/VISTARIL) 25 MG tablet Take 1 tablet (25 mg total) by mouth 3 (three) times daily as needed for itching. 90 tablet 5  . ranitidine (ZANTAC) 150 MG tablet Take 1 tablet (150 mg total) by mouth 2 (two) times daily. 60 tablet 5  . sertraline (ZOLOFT) 50 MG tablet Take 50 mg by mouth daily.    Marland Kitchen triamcinolone cream (KENALOG) 0.1 % Apply twice a day if needed to red itchy areas below the face. 45 g  3  . valACYclovir (VALTREX) 1000 MG tablet     . metoprolol succinate (TOPROL-XL) 50 MG 24 hr tablet Take 1 tablet (50 mg total) by mouth daily. Take with or immediately following a meal. 90 tablet 3  . omalizumab (XOLAIR) 150 MG injection Inject 300 mg into the skin every 28 (twenty-eight) days. (Patient not taking: Reported on 10/14/2019) 2 each 11   Current Facility-Administered Medications  Medication Dose Route Frequency Provider Last Rate Last Admin  . omalizumab Geoffry Paradise) injection 300 mg  300 mg Subcutaneous Q28 days Stephannie Li A, MD   300 mg at 05/16/18 1203    Allergies:   Codeine, Ibuprofen, Penicillins, and Sulfa antibiotics    Social History:  The patient  reports that she has never smoked. She has never used smokeless tobacco. She reports that she does not drink alcohol or  use drugs.   Family History:  The patient's family history includes COPD in her mother; Emphysema in her mother.  However, she does not know much about her family history.   ROS:  Please see the history of present illness.   Otherwise, review of systems are positive for none.   All other systems are reviewed and negative.    PHYSICAL EXAM: VS:  BP 122/84   Pulse 89   Ht 5\' 6"  (1.676 m)   Wt 151 lb (68.5 kg)   SpO2 99%   BMI 24.37 kg/m  , BMI Body mass index is 24.37 kg/m. GENERAL:  Well appearing HEENT:  Pupils equal round and reactive, fundi not visualized, oral mucosa unremarkable NECK:  No jugular venous distention, waveform within normal limits, carotid upstroke brisk and symmetric, no bruits, no thyromegaly LYMPHATICS:  No cervical, inguinal adenopathy LUNGS:  Clear to auscultation bilaterally BACK:  No CVA tenderness CHEST:  Unremarkable HEART:  PMI not displaced or sustained,S1 and S2 within normal limits, no S3, no S4, no clicks, no rubs, no murmurs ABD:  Flat, positive bowel sounds normal in frequency in pitch, no bruits, no rebound, no guarding, no midline pulsatile mass, no hepatomegaly, no splenomegaly EXT:  2 plus pulses throughout, no edema, no cyanosis no clubbing SKIN:  No rashes no nodules NEURO:  Cranial nerves II through XII grossly intact, motor grossly intact throughout PSYCH:  Cognitively intact, oriented to person place and time    EKG:  EKG is ordered today. The ekg ordered today demonstrates sinus rhythm, rate 91, axis within normal limits, intervals within normal limits, no acute ST-T wave changes.   Recent Labs: No results found for requested labs within last 8760 hours.    Lipid Panel No results found for: CHOL, TRIG, HDL, CHOLHDL, VLDL, LDLCALC, LDLDIRECT    Wt Readings from Last 3 Encounters:  10/14/19 151 lb (68.5 kg)  05/10/18 149 lb 3.2 oz (67.7 kg)  04/24/17 149 lb (67.6 kg)      Other studies Reviewed: Additional studies/ records  that were reviewed today include: Extensive [facility] records including Holter. Review of the above records demonstrates:  Please see elsewhere in the note.     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  I am going to start with salt loading height and hydration and physical activity.   Knowing the significant failure rate I would also suggest starting metoprolol XL 50 mg daily.  She might ultimately need ivabradine.    Current medicines are reviewed at length with the patient today.  The patient does not have concerns  regarding medicines.  The following changes have been made:  no change  Labs/ tests ordered today include:   Orders Placed This Encounter  Procedures  . EKG 12-Lead     Disposition:   FU with me in one month.     Signed, Minus Breeding, MD  10/14/2019 11:08 AM    [place]

## 2019-10-14 ENCOUNTER — Encounter: Payer: Self-pay | Admitting: Cardiology

## 2019-10-14 ENCOUNTER — Ambulatory Visit (INDEPENDENT_AMBULATORY_CARE_PROVIDER_SITE_OTHER): Payer: BC Managed Care – PPO | Admitting: Cardiology

## 2019-10-14 ENCOUNTER — Other Ambulatory Visit: Payer: Self-pay

## 2019-10-14 VITALS — BP 122/84 | HR 89 | Ht 66.0 in | Wt 151.0 lb

## 2019-10-14 DIAGNOSIS — R0602 Shortness of breath: Secondary | ICD-10-CM | POA: Diagnosis not present

## 2019-10-14 DIAGNOSIS — Z7189 Other specified counseling: Secondary | ICD-10-CM

## 2019-10-14 DIAGNOSIS — R000 Tachycardia, unspecified: Secondary | ICD-10-CM

## 2019-10-14 MED ORDER — METOPROLOL SUCCINATE ER 50 MG PO TB24
50.0000 mg | ORAL_TABLET | Freq: Every day | ORAL | 3 refills | Status: DC
Start: 2019-10-14 — End: 2019-11-18

## 2019-10-14 NOTE — Patient Instructions (Signed)
Medication Instructions:  START METOPROLOL SUCCINATE (TOPROL XL) 50MG  DAILY *If you need a refill on your cardiac medications before your next appointment, please call your pharmacy*  Lab Work: NONE ORDERED THIS VISIT  Testing/Procedures: NONE ORDERED THIS VISIT  Follow-Up: At [facility], you and your health needs are our priority.  As part of our continuing mission to provide you with exceptional heart care, we have created designated Provider Care Teams.  These Care Teams include your primary Cardiologist (physician) and Advanced Practice Providers (APPs -  Physician Assistants and Nurse Practitioners) who all work together to provide you with the care you need, when you need it.  We recommend signing up for the patient portal called "MyChart".  Sign up information is provided on this After Visit Summary.  MyChart is used to connect with patients for Virtual Visits (Telemedicine).  Patients are able to view lab/test results, encounter notes, upcoming appointments, etc.  Non-urgent messages can be sent to your provider as well.   To learn more about what you can do with MyChart, go to [facility].    Your next appointment:   1 month(s)  The format for your next appointment:   In Person  Provider:   [email or web address], MD

## 2019-11-16 NOTE — Progress Notes (Signed)
Cardiology Office Note   Date:  11/18/2019   ID:  Tammy Richard, DOB Nov 11, 1975, MRN [id number]  PCP:  Bosie Clos, MD  Cardiologist:   No primary care provider on file. Referring:  Bosie Clos, MD  Chief Complaint  Patient presents with  . Tachycardia      History of Present Illness: Tammy Richard is a 44 y.o. female who presents for a second opinion of tachycardia.  She is referred by Bosie Clos, MD  She had an echocardiogram in February.  This demonstrated normal left ventricular function.  There were no valvular abnormalities.  Holter monitor demonstrated rare single PVCs.  There were total of 14 only.  There were no other arrhythmias.  She had labs that included normal thyroids.  She was diagnosed with sinus tachycardia thought to be related to anxiety but was seeking a second opinion on this.   I saw her for this in May.   She had tachycardia which I treated with salt loading and beta blocker.     She started taking the metoprolol XL 25 mg at night but this made her feel really poorly.  Her blood pressure dropped from the 110s to 80s systolic.  It did seem to control her heart rate.  However, she was exceptionally fatigued.  She also had some vaginal bleeding.   Past Medical History:  Diagnosis Date  . ADHD (attention deficit hyperactivity disorder)   . Urticaria     Past Surgical History:  Procedure Laterality Date  . APPENDECTOMY       Current Outpatient Medications  Medication Sig Dispense Refill  . amphetamine-dextroamphetamine (ADDERALL XR) 20 MG 24 hr capsule Take by mouth.    . Cetirizine HCl 10 MG CAPS Take by mouth.    . diphenhydrAMINE (BENADRYL) 12.5 MG chewable tablet Chew by mouth.    . EPINEPHrine (AUVI-Q) 0.3 mg/0.3 mL IJ SOAJ injection Use as directed for severe allergic reaction. 2 Device 2  . EPINEPHrine (EPIPEN 2-PAK) 0.3 mg/0.3 mL IJ SOAJ injection Inject into the muscle once.    . hydrocortisone cream 1 % Apply twice a day  to red itchy areas on face. 30 g 0  . hydrOXYzine (ATARAX/VISTARIL) 25 MG tablet Take 1 tablet (25 mg total) by mouth 3 (three) times daily as needed for itching. 90 tablet 5  . omalizumab (XOLAIR) 150 MG injection Inject 300 mg into the skin every 28 (twenty-eight) days. 2 each 11  . ranitidine (ZANTAC) 150 MG tablet Take 1 tablet (150 mg total) by mouth 2 (two) times daily. 60 tablet 5  . sertraline (ZOLOFT) 50 MG tablet Take 50 mg by mouth daily.    Marland Kitchen triamcinolone cream (KENALOG) 0.1 % Apply twice a day if needed to red itchy areas below the face. 45 g 3  . valACYclovir (VALTREX) 1000 MG tablet     . metoprolol tartrate (LOPRESSOR) 25 MG tablet Take 0.5 tablets (12.5 mg total) by mouth every 6 (six) hours as needed (TACHYCARDIA). 90 tablet 3   Current Facility-Administered Medications  Medication Dose Route Frequency Provider Last Rate Last Admin  . omalizumab Geoffry Paradise) injection 300 mg  300 mg Subcutaneous Q28 days Stephannie Li A, MD   300 mg at 05/16/18 1203    Allergies:   Codeine, Ibuprofen, Penicillins, and Sulfa antibiotics   ROS:  Please see the history of present illness.   Otherwise, review of systems are positive for none.   All other systems are reviewed  and negative.    PHYSICAL EXAM: VS:  BP 118/76   Pulse 97   Ht 5\' 6"  (1.676 m)   Wt 152 lb (68.9 kg)   SpO2 99%   BMI 24.53 kg/m  , BMI Body mass index is 24.53 kg/m. GENERAL:  Well appearing NECK:  No jugular venous distention, waveform within normal limits, carotid upstroke brisk and symmetric, no bruits, no thyromegaly LUNGS:  Clear to auscultation bilaterally CHEST:  Unremarkable HEART:  PMI not displaced or sustained,S1 and S2 within normal limits, no S3, no S4, no clicks, no rubs, no murmurs ABD:  Flat, positive bowel sounds normal in frequency in pitch, no bruits, no rebound, no guarding, no midline pulsatile mass, no hepatomegaly, no splenomegaly EXT:  2 plus pulses throughout, no edema, no cyanosis no  clubbing   EKG:  EKG is not ordered today.    Recent Labs: No results found for requested labs within last 8760 hours.    Lipid Panel No results found for: CHOL, TRIG, HDL, CHOLHDL, VLDL, LDLCALC, LDLDIRECT    Wt Readings from Last 3 Encounters:  11/18/19 152 lb (68.9 kg)  10/14/19 151 lb (68.5 kg)  05/10/18 149 lb 3.2 oz (67.7 kg)      Other studies Reviewed: Additional studies/ records that were reviewed today include: None Review of the above records demonstrates:  None  ASSESSMENT AND PLAN:   TACHYCARDIA:   She has some inappropriate sinus tachycardia.  She is done salt loading.  The next step having failed Toprol-XL is to try metoprolol immediate release with some liberal dosing guidelines of 12.5 mg every 6 hours as needed.  If this does not work I might try calcium channel blocker.   Current medicines are reviewed at length with the patient today.  The patient does not have concerns regarding medicines.  The following changes have been made:  no change  Labs/ tests ordered today include:   No orders of the defined types were placed in this encounter.    Disposition:   FU with me in 2 month.     Signed, Minus Breeding, MD  11/18/2019 9:38 AM    [facility]

## 2019-11-18 ENCOUNTER — Other Ambulatory Visit: Payer: Self-pay

## 2019-11-18 ENCOUNTER — Ambulatory Visit: Payer: BC Managed Care – PPO | Admitting: Cardiology

## 2019-11-18 ENCOUNTER — Encounter: Payer: Self-pay | Admitting: Cardiology

## 2019-11-18 VITALS — BP 118/76 | HR 97 | Ht 66.0 in | Wt 152.0 lb

## 2019-11-18 DIAGNOSIS — R000 Tachycardia, unspecified: Secondary | ICD-10-CM

## 2019-11-18 MED ORDER — METOPROLOL TARTRATE 25 MG PO TABS
12.5000 mg | ORAL_TABLET | Freq: Four times a day (QID) | ORAL | 3 refills | Status: DC | PRN
Start: 2019-11-18 — End: 2021-02-16

## 2019-11-18 NOTE — Patient Instructions (Signed)
Medication Instructions:  STOP TOPROL XL START METOPROLOL TARTRATE 12.5MG  EVERY 6 HOURS AS NEEDED *If you need a refill on your cardiac medications before your next appointment, please call your pharmacy*  Lab Work: NONE ORDERED THIS VISIT  Testing/Procedures: NONE ORDERED THIS VISIT  Follow-Up: At [facility], you and your health needs are our priority.  As part of our continuing mission to provide you with exceptional heart care, we have created designated Provider Care Teams.  These Care Teams include your primary Cardiologist (physician) and Advanced Practice Providers (APPs -  Physician Assistants and Nurse Practitioners) who all work together to provide you with the care you need, when you need it.   Your next appointment:   2 month(s)  The format for your next appointment:   In Person  Provider:   Rollene Rotunda, MD

## 2020-01-16 DIAGNOSIS — Z7189 Other specified counseling: Secondary | ICD-10-CM | POA: Insufficient documentation

## 2020-01-16 DIAGNOSIS — R000 Tachycardia, unspecified: Secondary | ICD-10-CM | POA: Insufficient documentation

## 2020-01-16 NOTE — Progress Notes (Deleted)
Cardiology Office Note   Date:  01/16/2020   ID:  Tammy Richard, DOB 08-30-1975, MRN [id number]  PCP:  Bosie Clos, MD  Cardiologist:   No primary care provider on file. Referring:  Bosie Clos, MD  No chief complaint on file.     History of Present Illness: Tammy Richard is a 44 y.o. female who presents for a second opinion of tachycardia.  She is referred by Bosie Clos, MD  She had an echocardiogram in February.  This demonstrated normal left ventricular function.  There were no valvular abnormalities.  Holter monitor demonstrated rare single PVCs.  There were total of 14 only.  There were no other arrhythmias.  She had labs that included normal thyroids.  She was diagnosed with sinus tachycardia thought to be related to anxiety but was seeking a second opinion on this.   I saw her for this in May.   She had tachycardia which I treated with salt loading and beta blocker.   She started taking the metoprolol XL 25 mg at night but this made her feel really poorly.  Her blood pressure dropped from the 110s to 80s systolic.  I treated her with immediate release beta blocker.  ***   *** It did seem to control her heart rate.  However, she was exceptionally fatigued.  She also had some vaginal bleeding.   Past Medical History:  Diagnosis Date   ADHD (attention deficit hyperactivity disorder)    Urticaria     Past Surgical History:  Procedure Laterality Date   APPENDECTOMY       Current Outpatient Medications  Medication Sig Dispense Refill   amphetamine-dextroamphetamine (ADDERALL XR) 20 MG 24 hr capsule Take by mouth.     Cetirizine HCl 10 MG CAPS Take by mouth.     diphenhydrAMINE (BENADRYL) 12.5 MG chewable tablet Chew by mouth.     EPINEPHrine (AUVI-Q) 0.3 mg/0.3 mL IJ SOAJ injection Use as directed for severe allergic reaction. 2 Device 2   EPINEPHrine (EPIPEN 2-PAK) 0.3 mg/0.3 mL IJ SOAJ injection Inject into the muscle once.      hydrocortisone cream 1 % Apply twice a day to red itchy areas on face. 30 g 0   hydrOXYzine (ATARAX/VISTARIL) 25 MG tablet Take 1 tablet (25 mg total) by mouth 3 (three) times daily as needed for itching. 90 tablet 5   metoprolol tartrate (LOPRESSOR) 25 MG tablet Take 0.5 tablets (12.5 mg total) by mouth every 6 (six) hours as needed (TACHYCARDIA). 90 tablet 3   omalizumab (XOLAIR) 150 MG injection Inject 300 mg into the skin every 28 (twenty-eight) days. 2 each 11   ranitidine (ZANTAC) 150 MG tablet Take 1 tablet (150 mg total) by mouth 2 (two) times daily. 60 tablet 5   sertraline (ZOLOFT) 50 MG tablet Take 50 mg by mouth daily.     triamcinolone cream (KENALOG) 0.1 % Apply twice a day if needed to red itchy areas below the face. 45 g 3   valACYclovir (VALTREX) 1000 MG tablet      Current Facility-Administered Medications  Medication Dose Route Frequency Provider Last Rate Last Admin   omalizumab Geoffry Paradise) injection 300 mg  300 mg Subcutaneous Q28 days Stephannie Li A, MD   300 mg at 05/16/18 1203    Allergies:   Codeine, Ibuprofen, Penicillins, and Sulfa antibiotics   ROS:  Please see the history of present illness.   Otherwise, review of systems are positive for ***  All other systems are reviewed and negative.    PHYSICAL EXAM: VS:  There were no vitals taken for this visit. , BMI There is no height or weight on file to calculate BMI. GENERAL:  Well appearing NECK:  No jugular venous distention, waveform within normal limits, carotid upstroke brisk and symmetric, no bruits, no thyromegaly LUNGS:  Clear to auscultation bilaterally CHEST:  Unremarkable HEART:  PMI not displaced or sustained,S1 and S2 within normal limits, no S3, no S4, no clicks, no rubs, *** murmurs ABD:  Flat, positive bowel sounds normal in frequency in pitch, no bruits, no rebound, no guarding, no midline pulsatile mass, no hepatomegaly, no splenomegaly EXT:  2 plus pulses throughout, no edema, no cyanosis  no clubbing    ***GENERAL:  Well appearing NECK:  No jugular venous distention, waveform within normal limits, carotid upstroke brisk and symmetric, no bruits, no thyromegaly LUNGS:  Clear to auscultation bilaterally CHEST:  Unremarkable HEART:  PMI not displaced or sustained,S1 and S2 within normal limits, no S3, no S4, no clicks, no rubs, no murmurs ABD:  Flat, positive bowel sounds normal in frequency in pitch, no bruits, no rebound, no guarding, no midline pulsatile mass, no hepatomegaly, no splenomegaly EXT:  2 plus pulses throughout, no edema, no cyanosis no clubbing   EKG:  EKG is *** ordered today. ***   Recent Labs: No results found for requested labs within last 8760 hours.    Lipid Panel No results found for: CHOL, TRIG, HDL, CHOLHDL, VLDL, LDLCALC, LDLDIRECT    Wt Readings from Last 3 Encounters:  11/18/19 152 lb (68.9 kg)  10/14/19 151 lb (68.5 kg)  05/10/18 149 lb 3.2 oz (67.7 kg)      Other studies Reviewed: Additional studies/ records that were reviewed today include: *** Review of the above records demonstrates:  ***  ASSESSMENT AND PLAN:   TACHYCARDIA:   ***  She has some inappropriate sinus tachycardia.  She is done salt loading.  The next step having failed Toprol-XL is to try metoprolol immediate release with some liberal dosing guidelines of 12.5 mg every 6 hours as needed.  If this does not work I might try calcium channel blocker.  COVID EDUCATION:  ***   Current medicines are reviewed at length with the patient today.  The patient does not have concerns regarding medicines.  The following changes have been made:  ***  Labs/ tests ordered today include: ***  No orders of the defined types were placed in this encounter.    Disposition:   FU with me in *** month.     Signed, Rollene Rotunda, MD  01/16/2020 8:09 PM    [facility]

## 2020-01-17 ENCOUNTER — Ambulatory Visit: Payer: BC Managed Care – PPO | Admitting: Cardiology

## 2020-01-17 DIAGNOSIS — R000 Tachycardia, unspecified: Secondary | ICD-10-CM

## 2020-01-17 DIAGNOSIS — Z7189 Other specified counseling: Secondary | ICD-10-CM

## 2021-02-15 NOTE — Progress Notes (Signed)
Office Visit    Patient Name: Tammy Richard Date of Encounter: 02/16/2021  PCP:  Bosie Clos, MD   [facility]  Cardiologist:  Rollene Rotunda, MD  Advanced Practice Provider:  No care team member to display Electrophysiologist:  None      Chief Complaint    Tammy Richard is a 45 y.o. female with a hx of tachycardia, COVID 19 presents today for follow up of tachycardia   Past Medical History    Past Medical History:  Diagnosis Date   ADHD (attention deficit hyperactivity disorder)    Urticaria    Past Surgical History:  Procedure Laterality Date   APPENDECTOMY      Allergies  Allergies  Allergen Reactions   Codeine    Ibuprofen    Penicillins    Sulfa Antibiotics Rash    History of Present Illness    Tammy Richard is a 45 y.o. female with a hx of tachycardia, COVID19 last seen 11/18/19 by Dr. Antoine Poche.  Previous echocardiogram 07/2019 with normal LVEF, no valvular abnormalities. Monitor at that time with rare single PVC's and no other arrhythmia. She was diagnosed with sinus tachycardia which was thought to be related to anxiety. She was started on Toprol 25mg  QHS as well as salt loading. She was intolerant to Toprol with fatigue. She was transitioned to Lopressor 12.5mg  q6h PRN.   She had hemorrhoid removal 01/26/21. Contacted surgeon office the next day noting episode of syncope. Noted no excessive bleeding and had not eaten anything.   She presents today for follow up. Notes isolated syncopal episode after surgery with no recurrence. Tells me the next day her heart rate got up to the 180s and she felt fatigued. Since that time has felt well. Reports no chest pain. She will have mild chest tightness when her heart rate is elevated which self resolves. No shortness of breath nor dyspnea. Heart rate routinely 140-150bpm while active and 100-120 while sitting. She has not taken Metoprolol as she felt poorly all the time no matter what  time she took it.   EKGs/Labs/Other Studies Reviewed:   The following studies were reviewed today:  Echo 07/2019 -  SUMMARY  Left ventricular systolic function is normal.  LV ejection fraction = 60-65%.  No significant valve disease  No old study for comparison  -  FINDINGS:  LEFT VENTRICLE  The left ventricular size is normal. There is normal left ventricular  wall thickness. LV ejection fraction = 60-65%. Left ventricular  systolic function is normal. Left ventricular filling pattern is  normal. The left ventricular wall motion is normal.   -  RIGHT VENTRICLE  The right ventricle is normal in size and function.   LEFT ATRIUM  The left atrial size is normal.   RIGHT ATRIUM  Right atrial size is normal.  -  AORTIC VALVE  The aortic valve is not well visualized. There is no aortic stenosis.  There is no aortic regurgitation.  -  MITRAL VALVE  The mitral valve is normal in structure and function. There is no  mitral regurgitation noted.  -  TRICUSPID VALVE  Structurally normal tricuspid valve. No tricuspid regurgitation.  -  PULMONIC VALVE  The pulmonic valve is not well visualized. There is no pulmonic  valvular stenosis. There is no pulmonic valvular regurgitation.  -  ARTERIES  The aortic sinus is normal size. The ascending aorta is normal size.  -  VENOUS  IVC size was normal.  -  EFFUSION  There is no pericardial effusion. There is no pleural effusion.  -  -   EKG:  EKG is  ordered today.  The ekg ordered today demonstrates ST 110 bpm with no acute ST/T wave changes.   Recent Labs: No results found for requested labs within last 8760 hours.  Recent Lipid Panel No results found for: CHOL, TRIG, HDL, CHOLHDL, VLDL, LDLCALC, LDLDIRECT   Home Medications   Current Meds  Medication Sig   diltiazem (CARDIZEM SR) 60 MG 12 hr capsule Take 1 capsule (60 mg total) by mouth 2 (two) times daily as needed.   diphenhydrAMINE (BENADRYL) 12.5 MG chewable tablet  Chew by mouth.   EPINEPHrine (AUVI-Q) 0.3 mg/0.3 mL IJ SOAJ injection Use as directed for severe allergic reaction.   EPINEPHrine 0.3 mg/0.3 mL IJ SOAJ injection Inject into the muscle once.   hydrocortisone cream 1 % Apply twice a day to red itchy areas on face.   valACYclovir (VALTREX) 1000 MG tablet    Current Facility-Administered Medications for the 02/16/21 encounter (Office Visit) with Alver Sorrow, NP  Medication   omalizumab Geoffry Paradise) injection 300 mg     Review of Systems      All other systems reviewed and are otherwise negative except as noted above.  Physical Exam    VS:  BP 116/80   Pulse (!) 110   Ht 5\' 6"  (1.676 m)   Wt 151 lb 11.2 oz (68.8 kg)   SpO2 98%   BMI 24.49 kg/m  , BMI Body mass index is 24.49 kg/m.  Wt Readings from Last 3 Encounters:  02/16/21 151 lb 11.2 oz (68.8 kg)  11/18/19 152 lb (68.9 kg)  10/14/19 151 lb (68.5 kg)     GEN: Well nourished, well developed, in no acute distress. HEENT: normal. Neck: Supple, no JVD, carotid bruits, or masses. Cardiac: tachycardic, RRR, no murmurs, rubs, or gallops. No clubbing, cyanosis, edema.  Radials/PT 2+ and equal bilaterally.  Respiratory:  Respirations regular and unlabored, clear to auscultation bilaterally. GI: Soft, nontender, nondistended. MS: No deformity or atrophy. Skin: Warm and dry, no rash. Neuro:  Strength and sensation are intact. Psych: Normal affect.  Assessment & Plan    Tachycardia - Sinus tachycardia stable which has been present since having COVID. Intolerant to Toprol and Lopressor. Will Rx Diltiazem 60mg  BID PRN for palpitations, tachycardia. If she tolerates can consider transition to extended release. Echo 07/2019 normal LVEF and no valvular abnormalities. Encouraged to avoid caffeine, stay well hydrated, avoid alcohol, manage stress well.   Syncope - Isolated episode after surgery. Likely vasovagal in nature as she had hemorrhoid surgery or dehydration. No recurrent  lightheadedness, dizziness, near syncope, syncope. Echo 07/2019 with normal LVEF and heart valves, will defer repeat echo as isolated episode but ir  Disposition: Follow up in 1 year(s) with Dr. 07/2019 or APP.  Signed, 07/2019, NP 02/16/2021, 8:37 AM  [facility]

## 2021-02-16 ENCOUNTER — Ambulatory Visit (HOSPITAL_BASED_OUTPATIENT_CLINIC_OR_DEPARTMENT_OTHER): Payer: Managed Care, Other (non HMO) | Admitting: Family

## 2021-02-16 ENCOUNTER — Encounter (HOSPITAL_BASED_OUTPATIENT_CLINIC_OR_DEPARTMENT_OTHER): Payer: Self-pay | Admitting: Family

## 2021-02-16 ENCOUNTER — Other Ambulatory Visit: Payer: Self-pay

## 2021-02-16 VITALS — BP 116/80 | HR 110 | Ht 66.0 in | Wt 151.7 lb

## 2021-02-16 DIAGNOSIS — R002 Palpitations: Secondary | ICD-10-CM | POA: Diagnosis not present

## 2021-02-16 DIAGNOSIS — R000 Tachycardia, unspecified: Secondary | ICD-10-CM

## 2021-02-16 DIAGNOSIS — R55 Syncope and collapse: Secondary | ICD-10-CM

## 2021-02-16 MED ORDER — DILTIAZEM HCL ER 60 MG PO CP12: 60.0000 mg | ORAL_CAPSULE | Freq: Two times a day (BID) | ORAL | 2 refills | Status: DC | PRN

## 2021-02-16 NOTE — Patient Instructions (Addendum)
Medication Instructions:  Your physician has recommended you make the following change in your medication:   START Diltiazem 60mg  as needed twice daily for palpitations  *If you need a refill on your cardiac medications before your next appointment, please call your pharmacy*  Lab Work: None ordered today.  Testing/Procedures: Your KEG today shows sinus tachycardia.    Follow-Up: At [facility], you and your health needs are our priority.  As part of our continuing mission to provide you with exceptional heart care, we have created designated Provider Care Teams.  These Care Teams include your primary Cardiologist (physician) and Advanced Practice Providers (APPs -  Physician Assistants and Nurse Practitioners) who all work together to provide you with the care you need, when you need it.  We recommend signing up for the patient portal called "MyChart".  Sign up information is provided on this After Visit Summary.  MyChart is used to connect with patients for Virtual Visits (Telemedicine).  Patients are able to view lab/test results, encounter notes, upcoming appointments, etc.  Non-urgent messages can be sent to your provider as well.   To learn more about what you can do with MyChart, go to [facility].    Your next appointment:   1 year(s)  The format for your next appointment:   In Person  Provider:   You may see [email or web address], MD or one of the following Advanced Practice Providers on your designated Care Team:   Rollene Rotunda, PA-C Theodore Demark, PA-C Juanda Crumble, DNP, ANP   Other Instructions  Heart Healthy Diet Recommendations: A low-salt diet is recommended. Meats should be grilled, baked, or boiled. Avoid fried foods. Focus on lean protein sources like fish or chicken with vegetables and fruits. The American Heart Association is a Joni Reining!    Exercise recommendations: The American Heart Association recommends 150 minutes of moderate  intensity exercise weekly. Try 30 minutes of moderate intensity exercise 4-5 times per week. This could include walking, jogging, or swimming.

## 2021-08-19 ENCOUNTER — Encounter: Payer: Self-pay | Admitting: Cardiology

## 2022-04-07 ENCOUNTER — Encounter: Payer: Self-pay | Admitting: Cardiology

## 2024-04-09 ENCOUNTER — Other Ambulatory Visit: Payer: Self-pay

## 2024-04-09 ENCOUNTER — Encounter (HOSPITAL_BASED_OUTPATIENT_CLINIC_OR_DEPARTMENT_OTHER): Payer: Self-pay | Admitting: Emergency Medicine

## 2024-04-09 ENCOUNTER — Inpatient Hospital Stay (HOSPITAL_BASED_OUTPATIENT_CLINIC_OR_DEPARTMENT_OTHER)
Admission: EM | Admit: 2024-04-09 | Discharge: 2024-04-13 | DRG: 418 | Disposition: A | Discharge status: Home / Self Care | Attending: General Surgery | Admitting: General Surgery

## 2024-04-09 DIAGNOSIS — F419 Anxiety disorder, unspecified: Secondary | ICD-10-CM | POA: Diagnosis present

## 2024-04-09 DIAGNOSIS — K8062 Calculus of gallbladder and bile duct with acute cholecystitis without obstruction: Secondary | ICD-10-CM | POA: Diagnosis not present

## 2024-04-09 DIAGNOSIS — Z882 Allergy status to sulfonamides status: Secondary | ICD-10-CM

## 2024-04-09 DIAGNOSIS — Z9049 Acquired absence of other specified parts of digestive tract: Secondary | ICD-10-CM

## 2024-04-09 DIAGNOSIS — Z825 Family history of asthma and other chronic lower respiratory diseases: Secondary | ICD-10-CM

## 2024-04-09 DIAGNOSIS — K810 Acute cholecystitis: Principal | ICD-10-CM | POA: Diagnosis present

## 2024-04-09 DIAGNOSIS — Z886 Allergy status to analgesic agent status: Secondary | ICD-10-CM

## 2024-04-09 DIAGNOSIS — K9186 Retained cholelithiasis following cholecystectomy: Secondary | ICD-10-CM | POA: Diagnosis not present

## 2024-04-09 DIAGNOSIS — R748 Abnormal levels of other serum enzymes: Secondary | ICD-10-CM | POA: Diagnosis present

## 2024-04-09 DIAGNOSIS — Z885 Allergy status to narcotic agent status: Secondary | ICD-10-CM

## 2024-04-09 DIAGNOSIS — Z880 Allergy status to penicillin: Secondary | ICD-10-CM

## 2024-04-09 LAB — CBC
HCT: 38.5 % (ref 36.0–46.0)
Hemoglobin: 13.3 g/dL (ref 12.0–15.0)
MCH: 30.9 pg (ref 26.0–34.0)
MCHC: 34.5 g/dL (ref 30.0–36.0)
MCV: 89.5 fL (ref 80.0–100.0)
Platelets: 261 K/uL (ref 150–400)
RBC: 4.3 MIL/uL (ref 3.87–5.11)
RDW: 12.1 % (ref 11.5–15.5)
WBC: 8.4 K/uL (ref 4.0–10.5)
nRBC: 0 % (ref 0.0–0.2)

## 2024-04-09 MED ORDER — ONDANSETRON HCL 4 MG/2ML IJ SOLN
4.0000 mg | Freq: Once | INTRAMUSCULAR | Status: AC
  Administered 2024-04-09: 4 mg via INTRAVENOUS
  Filled 2024-04-09: qty 2

## 2024-04-09 MED ORDER — LACTATED RINGERS IV BOLUS
1000.0000 mL | Freq: Once | INTRAVENOUS | Status: AC
  Administered 2024-04-10: 1000 mL via INTRAVENOUS

## 2024-04-09 MED ORDER — HYDROMORPHONE HCL 1 MG/ML IJ SOLN
1.0000 mg | Freq: Once | INTRAMUSCULAR | Status: AC
  Administered 2024-04-09: 1 mg via INTRAVENOUS
  Filled 2024-04-09: qty 1

## 2024-04-09 NOTE — ED Triage Notes (Signed)
 Chest pain that goes to back on and off for a month. Worse today. Denies Hx or injury.

## 2024-04-10 ENCOUNTER — Emergency Department (HOSPITAL_COMMUNITY): Admitting: Certified Registered Nurse Anesthetist

## 2024-04-10 ENCOUNTER — Encounter (HOSPITAL_COMMUNITY): Payer: Self-pay

## 2024-04-10 ENCOUNTER — Emergency Department (HOSPITAL_BASED_OUTPATIENT_CLINIC_OR_DEPARTMENT_OTHER)

## 2024-04-10 ENCOUNTER — Encounter (HOSPITAL_COMMUNITY): Admission: EM | Disposition: A | Discharge status: Home / Self Care | Payer: Self-pay | Source: Home / Self Care

## 2024-04-10 DIAGNOSIS — Z9049 Acquired absence of other specified parts of digestive tract: Secondary | ICD-10-CM

## 2024-04-10 DIAGNOSIS — K810 Acute cholecystitis: Secondary | ICD-10-CM | POA: Diagnosis not present

## 2024-04-10 HISTORY — PX: CHOLECYSTECTOMY: SHX55

## 2024-04-10 LAB — HEPATIC FUNCTION PANEL
ALT: 8 U/L (ref 0–44)
AST: 15 U/L (ref 15–41)
Albumin: 4.8 g/dL (ref 3.5–5.0)
Alkaline Phosphatase: 55 U/L (ref 38–126)
Bilirubin, Direct: 0.3 mg/dL — ABNORMAL HIGH (ref 0.0–0.2)
Indirect Bilirubin: 0.4 mg/dL (ref 0.3–0.9)
Total Bilirubin: 0.7 mg/dL (ref 0.0–1.2)
Total Protein: 7.2 g/dL (ref 6.5–8.1)

## 2024-04-10 LAB — BASIC METABOLIC PANEL WITH GFR
Anion gap: 12 (ref 5–15)
BUN: 16 mg/dL (ref 6–20)
CO2: 24 mmol/L (ref 22–32)
Calcium: 10.5 mg/dL — ABNORMAL HIGH (ref 8.9–10.3)
Chloride: 102 mmol/L (ref 98–111)
Creatinine, Ser: 0.92 mg/dL (ref 0.44–1.00)
GFR, Estimated: >60 mL/min (ref >60)
Glucose, Bld: 101 mg/dL — ABNORMAL HIGH (ref 70–99)
Potassium: 3.7 mmol/L (ref 3.5–5.1)
Sodium: 138 mmol/L (ref 135–145)

## 2024-04-10 LAB — TROPONIN T, HIGH SENSITIVITY
Troponin T High Sensitivity: <15 ng/L (ref 0–19)
Troponin T High Sensitivity: <15 ng/L (ref 0–19)

## 2024-04-10 LAB — LIPASE, BLOOD: Lipase: 60 U/L — ABNORMAL HIGH (ref 11–51)

## 2024-04-10 LAB — HCG, SERUM, QUALITATIVE: Preg, Serum: NEGATIVE

## 2024-04-10 SURGERY — LAPAROSCOPIC CHOLECYSTECTOMY
Anesthesia: General

## 2024-04-10 MED ORDER — OXYCODONE HCL 5 MG/5ML PO SOLN: 5.0000 mg | Freq: Once | ORAL | Status: DC | PRN

## 2024-04-10 MED ORDER — MIDAZOLAM HCL (PF) 2 MG/2ML IJ SOLN
INTRAMUSCULAR | Status: DC | PRN
  Administered 2024-04-10 (×2): 2 mg via INTRAVENOUS

## 2024-04-10 MED ORDER — PROPOFOL 10 MG/ML IV BOLUS
INTRAVENOUS | Status: AC
Start: 2024-04-10 — End: 2024-04-10
  Filled 2024-04-10: qty 20

## 2024-04-10 MED ORDER — FENTANYL CITRATE (PF) 100 MCG/2ML IJ SOLN
25.0000 ug | INTRAMUSCULAR | Status: DC | PRN
  Administered 2024-04-10: 25 ug via INTRAVENOUS
  Administered 2024-04-10: 50 ug via INTRAVENOUS

## 2024-04-10 MED ORDER — SUCCINYLCHOLINE CHLORIDE 200 MG/10ML IV SOSY
PREFILLED_SYRINGE | INTRAVENOUS | Status: DC | PRN
Start: 2024-04-10 — End: 2024-04-10
  Administered 2024-04-10: 140 mg via INTRAVENOUS

## 2024-04-10 MED ORDER — HYDROMORPHONE HCL 1 MG/ML IJ SOLN
INTRAMUSCULAR | Status: DC | PRN
  Administered 2024-04-10: .5 mg via INTRAVENOUS

## 2024-04-10 MED ORDER — MIDAZOLAM HCL 2 MG/2ML IJ SOLN
INTRAMUSCULAR | Status: AC
  Filled 2024-04-10: qty 2

## 2024-04-10 MED ORDER — BUPIVACAINE-EPINEPHRINE 0.25% -1:200000 IJ SOLN
INTRAMUSCULAR | Status: DC | PRN
  Administered 2024-04-10: 9 mL

## 2024-04-10 MED ORDER — METHOCARBAMOL 1000 MG/10ML IJ SOLN
500.0000 mg | Freq: Three times a day (TID) | INTRAMUSCULAR | Status: DC | PRN
  Administered 2024-04-11: 500 mg via INTRAVENOUS
  Filled 2024-04-10: qty 10

## 2024-04-10 MED ORDER — PROPOFOL 10 MG/ML IV BOLUS
INTRAVENOUS | Status: DC | PRN
Start: 2024-04-10 — End: 2024-04-10
  Administered 2024-04-10: 150 mg via INTRAVENOUS

## 2024-04-10 MED ORDER — OXYCODONE HCL 5 MG PO TABS
5.0000 mg | ORAL_TABLET | ORAL | Status: DC | PRN
  Administered 2024-04-10 – 2024-04-12 (×4): 5 mg via ORAL
  Filled 2024-04-10 (×6): qty 1

## 2024-04-10 MED ORDER — DEXMEDETOMIDINE HCL IN NACL 80 MCG/20ML IV SOLN
INTRAVENOUS | Status: DC | PRN
  Administered 2024-04-10: 16 ug via INTRAVENOUS

## 2024-04-10 MED ORDER — LACTATED RINGERS IV SOLN: INTRAVENOUS | Status: DC

## 2024-04-10 MED ORDER — ONDANSETRON HCL 4 MG/2ML IJ SOLN
INTRAMUSCULAR | Status: DC | PRN
Start: 2024-04-10 — End: 2024-04-10
  Administered 2024-04-10: 4 mg via INTRAVENOUS

## 2024-04-10 MED ORDER — LIDOCAINE 2% (20 MG/ML) 5 ML SYRINGE
INTRAMUSCULAR | Status: AC
Start: 2024-04-10 — End: 2024-04-10
  Filled 2024-04-10: qty 5

## 2024-04-10 MED ORDER — LIDOCAINE 2% (20 MG/ML) 5 ML SYRINGE
INTRAMUSCULAR | Status: DC | PRN
  Administered 2024-04-10: 60 mg via INTRAVENOUS

## 2024-04-10 MED ORDER — METRONIDAZOLE 500 MG/100ML IV SOLN
500.0000 mg | Freq: Once | INTRAVENOUS | Status: AC
  Administered 2024-04-10: 500 mg via INTRAVENOUS
  Filled 2024-04-10: qty 100

## 2024-04-10 MED ORDER — SIMETHICONE 80 MG PO CHEW: 40.0000 mg | CHEWABLE_TABLET | Freq: Four times a day (QID) | ORAL | Status: DC | PRN

## 2024-04-10 MED ORDER — HYDROMORPHONE HCL 1 MG/ML IJ SOLN
0.5000 mg | INTRAMUSCULAR | Status: DC | PRN
  Administered 2024-04-10: 0.5 mg via INTRAVENOUS
  Filled 2024-04-10: qty 1

## 2024-04-10 MED ORDER — HYDROMORPHONE HCL 1 MG/ML IJ SOLN
INTRAMUSCULAR | Status: AC
  Filled 2024-04-10: qty 0.5

## 2024-04-10 MED ORDER — ROCURONIUM BROMIDE 10 MG/ML (PF) SYRINGE
PREFILLED_SYRINGE | INTRAVENOUS | Status: AC
Start: 2024-04-10 — End: 2024-04-10
  Filled 2024-04-10: qty 10

## 2024-04-10 MED ORDER — MORPHINE SULFATE (PF) 2 MG/ML IV SOLN
2.0000 mg | INTRAVENOUS | Status: DC | PRN
  Administered 2024-04-10 – 2024-04-11 (×3): 2 mg via INTRAVENOUS
  Filled 2024-04-10 (×3): qty 1

## 2024-04-10 MED ORDER — SUGAMMADEX SODIUM 200 MG/2ML IV SOLN
INTRAVENOUS | Status: DC | PRN
Start: 2024-04-10 — End: 2024-04-10
  Administered 2024-04-10: 200 mg via INTRAVENOUS

## 2024-04-10 MED ORDER — CIPROFLOXACIN IN D5W 400 MG/200ML IV SOLN
400.0000 mg | Freq: Once | INTRAVENOUS | Status: AC
  Administered 2024-04-10: 400 mg via INTRAVENOUS
  Filled 2024-04-10: qty 200

## 2024-04-10 MED ORDER — ROCURONIUM BROMIDE 10 MG/ML (PF) SYRINGE
PREFILLED_SYRINGE | INTRAVENOUS | Status: DC | PRN
  Administered 2024-04-10: 10 mg via INTRAVENOUS
  Administered 2024-04-10: 40 mg via INTRAVENOUS

## 2024-04-10 MED ORDER — ONDANSETRON HCL 4 MG/2ML IJ SOLN
4.0000 mg | INTRAMUSCULAR | Status: DC | PRN
  Administered 2024-04-10 – 2024-04-12 (×10): 4 mg via INTRAVENOUS
  Filled 2024-04-10 (×10): qty 2

## 2024-04-10 MED ORDER — HEPARIN SODIUM (PORCINE) 5000 UNIT/ML IJ SOLN
5000.0000 [IU] | Freq: Three times a day (TID) | INTRAMUSCULAR | Status: AC
  Administered 2024-04-11 (×2): 5000 [IU] via SUBCUTANEOUS
  Filled 2024-04-10 (×2): qty 1

## 2024-04-10 MED ORDER — METHOCARBAMOL 500 MG PO TABS
500.0000 mg | ORAL_TABLET | Freq: Three times a day (TID) | ORAL | Status: DC | PRN
  Administered 2024-04-10: 500 mg via ORAL
  Filled 2024-04-10: qty 1

## 2024-04-10 MED ORDER — ACETAMINOPHEN 500 MG PO TABS
1000.0000 mg | ORAL_TABLET | Freq: Four times a day (QID) | ORAL | Status: DC
  Administered 2024-04-10 – 2024-04-12 (×9): 1000 mg via ORAL
  Filled 2024-04-10 (×10): qty 2

## 2024-04-10 MED ORDER — DEXAMETHASONE SOD PHOSPHATE PF 10 MG/ML IJ SOLN
INTRAMUSCULAR | Status: DC | PRN
Start: 2024-04-10 — End: 2024-04-10
  Administered 2024-04-10: 10 mg via INTRAVENOUS

## 2024-04-10 MED ORDER — FENTANYL CITRATE (PF) 100 MCG/2ML IJ SOLN
INTRAMUSCULAR | Status: AC
  Filled 2024-04-10: qty 2

## 2024-04-10 MED ORDER — OXYCODONE HCL 5 MG PO TABS: 5.0000 mg | ORAL_TABLET | Freq: Once | ORAL | Status: DC | PRN

## 2024-04-10 MED ORDER — CHLORHEXIDINE GLUCONATE 0.12 % MT SOLN
15.0000 mL | Freq: Once | OROMUCOSAL | Status: AC
  Administered 2024-04-10: 15 mL via OROMUCOSAL
  Filled 2024-04-10: qty 15

## 2024-04-10 MED ORDER — IOHEXOL 300 MG/ML  SOLN
100.0000 mL | Freq: Once | INTRAMUSCULAR | Status: AC | PRN
  Administered 2024-04-10: 100 mL via INTRAVENOUS

## 2024-04-10 MED ORDER — HYDROMORPHONE HCL 1 MG/ML IJ SOLN
0.5000 mg | Freq: Once | INTRAMUSCULAR | Status: AC
  Administered 2024-04-10: 0.5 mg via INTRAVENOUS
  Filled 2024-04-10: qty 1

## 2024-04-10 MED ORDER — CEFAZOLIN SODIUM-DEXTROSE 1-4 GM/50ML-% IV SOLN
INTRAVENOUS | Status: DC | PRN
  Administered 2024-04-10: 2 g via INTRAVENOUS

## 2024-04-10 MED ORDER — BUPIVACAINE-EPINEPHRINE (PF) 0.25% -1:200000 IJ SOLN
INTRAMUSCULAR | Status: AC
  Filled 2024-04-10: qty 30

## 2024-04-10 MED ORDER — INDOCYANINE GREEN 25 MG IV SOLR
1.2500 mg | Freq: Once | INTRAVENOUS | Status: AC
  Administered 2024-04-10: 1.25 mg via INTRAVENOUS
  Filled 2024-04-10: qty 10

## 2024-04-10 MED ORDER — AMISULPRIDE (ANTIEMETIC) 5 MG/2ML IV SOLN: 10.0000 mg | Freq: Once | INTRAVENOUS | Status: DC | PRN

## 2024-04-10 MED ORDER — ONDANSETRON HCL 4 MG/2ML IJ SOLN
INTRAMUSCULAR | Status: AC
  Filled 2024-04-10: qty 2

## 2024-04-10 MED ORDER — DEXMEDETOMIDINE HCL IN NACL 80 MCG/20ML IV SOLN
INTRAVENOUS | Status: AC
  Filled 2024-04-10: qty 20

## 2024-04-10 MED ORDER — FENTANYL CITRATE (PF) 250 MCG/5ML IJ SOLN
INTRAMUSCULAR | Status: DC | PRN
Start: 2024-04-10 — End: 2024-04-10
  Administered 2024-04-10: 100 ug via INTRAVENOUS

## 2024-04-10 SURGICAL SUPPLY — 32 items
BAG COUNTER SPONGE SURGICOUNT (BAG) ×1 IMPLANT
BLADE CLIPPER SURG (BLADE) IMPLANT
CANISTER SUCTION 3000ML PPV (SUCTIONS) ×1 IMPLANT
CHLORAPREP W/TINT 26 (MISCELLANEOUS) ×1 IMPLANT
CLIP APPLIE 5 13 M/L LIGAMAX5 (MISCELLANEOUS) ×1 IMPLANT
COVER SURGICAL LIGHT HANDLE (MISCELLANEOUS) ×1 IMPLANT
DERMABOND ADVANCED .7 DNX12 (GAUZE/BANDAGES/DRESSINGS) ×1 IMPLANT
ELECTRODE REM PT RTRN 9FT ADLT (ELECTROSURGICAL) ×1 IMPLANT
GLOVE BIO SURGEON STRL SZ7 (GLOVE) ×1 IMPLANT
GLOVE BIOGEL PI IND STRL 7.5 (GLOVE) ×1 IMPLANT
GOWN STRL REUS W/ TWL LRG LVL3 (GOWN DISPOSABLE) ×3 IMPLANT
GRASPER SUT TROCAR 14GX15 (MISCELLANEOUS) ×1 IMPLANT
IRRIGATION SUCT STRKRFLW 2 WTP (MISCELLANEOUS) ×1 IMPLANT
KIT BASIN OR (CUSTOM PROCEDURE TRAY) ×1 IMPLANT
KIT IMAGING PINPOINTPAQ (MISCELLANEOUS) IMPLANT
KIT TURNOVER KIT B (KITS) ×1 IMPLANT
PAD ARMBOARD POSITIONER FOAM (MISCELLANEOUS) ×1 IMPLANT
POUCH RETRIEVAL ECOSAC 10 (ENDOMECHANICALS) ×1 IMPLANT
SCISSORS LAP 5X35 DISP (ENDOMECHANICALS) ×1 IMPLANT
SET TUBE SMOKE EVAC HIGH FLOW (TUBING) ×1 IMPLANT
SLEEVE Z-THREAD 5X100MM (TROCAR) ×2 IMPLANT
SOLN 0.9% NACL POUR BTL 1000ML (IV SOLUTION) ×1 IMPLANT
SOLN STERILE WATER BTL 1000 ML (IV SOLUTION) ×1 IMPLANT
STRIP CLOSURE SKIN 1/2X4 (GAUZE/BANDAGES/DRESSINGS) ×1 IMPLANT
SUT MNCRL AB 4-0 PS2 18 (SUTURE) ×1 IMPLANT
SUT VICRYL 0 UR6 27IN ABS (SUTURE) ×1 IMPLANT
TOWEL GREEN STERILE (TOWEL DISPOSABLE) ×1 IMPLANT
TOWEL GREEN STERILE FF (TOWEL DISPOSABLE) ×1 IMPLANT
TRAY LAPAROSCOPIC MC (CUSTOM PROCEDURE TRAY) ×1 IMPLANT
TROCAR BALLN 12MMX100 BLUNT (TROCAR) ×1 IMPLANT
TROCAR Z-THREAD OPTICAL 5X100M (TROCAR) ×1 IMPLANT
WARMER LAPAROSCOPE (MISCELLANEOUS) ×1 IMPLANT

## 2024-04-10 NOTE — H&P (Addendum)
 Tammy Richard 01/04/1976  [id number].    Requesting MD: Glendia Breeding Chief Complaint/Reason for Consult: Acute Cholecystitis  HPI: Tammy Richard is a 48 y.o. female who presented to the ED with abdominal pain. Patient reports over the last 1 month she has been having intermittent epigastric and ruq abdominal pain. She says these are not necessarily around PO intake but will happen randomly. She says the episodes would usually last a few hours before resolving. Most recent episode occurred last night with similar areas of pain but now also radiating to her back with associated nausea. No vomiting. She tried tums without relief. No frequent NSAID use. Reports hx of ulcers as child although is not sure if she has ever had an EGD. No hx of similar pain prior to 1 month ago. Her workup was concerning for Acute Cholecystitis. She was transferred to [facility] for further evaluation. She continues to have epigastric and ruq pain in pre-op.   She is not on any blood thinners. She has a hx of prior open appendectomy. No alcohol, tobacco or drug use. She works a lawyer. Last PO intake 11/11 PM.   ROS: ROS As above, see hpi  Family History  Problem Relation Age of Onset   COPD Mother    Emphysema Mother    Allergic rhinitis Neg Hx    Angioedema Neg Hx    Asthma Neg Hx    Eczema Neg Hx    Immunodeficiency Neg Hx    Urticaria Neg Hx     Past Medical History:  Diagnosis Date   ADHD (attention deficit hyperactivity disorder)    Urticaria     Past Surgical History:  Procedure Laterality Date   APPENDECTOMY      Social History:  reports that she has never smoked. She has never used smokeless tobacco. She reports that she does not drink alcohol and does not use drugs.  Allergies:  Allergies  Allergen Reactions   Codeine    Ibuprofen    Nsaids    Penicillins    Sulfa Antibiotics Rash    (Not in a hospital admission)    Physical Exam: Blood pressure (!) 103/59,  pulse 62, temperature 98.7 F (37.1 C), temperature source Oral, resp. rate 18, height 5' 6 (1.676 m), weight 63.5 kg, last menstrual period 03/25/2024, SpO2 99%. General: pleasant, WD/WN female who is laying in bed in NAD HEENT: head is normocephalic, atraumatic.  Sclera are non-icteric.  Heart: regular, rate, and rhythm.   Lungs: CTAB, no wheezes, rhonchi, or rales noted.  Respiratory effort nonlabored Abd:  Soft, ND, epigastric and ruq ttp, +BS. No masses, hernias, or organomegaly MS: no BUE or BLE edema Skin: warm and dry  Psych: A&Ox4 with an appropriate affect Neuro: normal speech, thought process intact, gait not assessed   Results for orders placed or performed during the hospital encounter of 04/09/24 (from the past 48 hours)  Basic metabolic panel     Status: Abnormal   Collection Time: 04/09/24 11:36 PM  Result Value Ref Range   Sodium 138 135 - 145 mmol/L   Potassium 3.7 3.5 - 5.1 mmol/L   Chloride 102 98 - 111 mmol/L   CO2 24 22 - 32 mmol/L   Glucose, Bld 101 (H) 70 - 99 mg/dL    Comment: Glucose reference range applies only to samples taken after fasting for at least 8 hours.   BUN 16 6 - 20 mg/dL   Creatinine, Ser 9.07 0.44 -  1.00 mg/dL   Calcium 89.4 (H) 8.9 - 10.3 mg/dL   GFR, Estimated >39 >39 mL/min    Comment: (NOTE) Calculated using the CKD-EPI Creatinine Equation (2021)    Anion gap 12 5 - 15    Comment: Performed at [facility], [address] [place], [place] [address]  CBC     Status: None   Collection Time: 04/09/24 11:36 PM  Result Value Ref Range   WBC 8.4 4.0 - 10.5 K/uL   RBC 4.30 3.87 - 5.11 MIL/uL   Hemoglobin 13.3 12.0 - 15.0 g/dL   HCT 61.4 63.9 - 53.9 %   MCV 89.5 80.0 - 100.0 fL   MCH 30.9 26.0 - 34.0 pg   MCHC 34.5 30.0 - 36.0 g/dL   RDW 87.8 88.4 - 84.4 %   Platelets 261 150 - 400 K/uL   nRBC 0.0 0.0 - 0.2 %    Comment: Performed at [facility], [address] [place], [place] [address]  Troponin T, High  Sensitivity     Status: None   Collection Time: 04/09/24 11:36 PM  Result Value Ref Range   Troponin T High Sensitivity <15 0 - 19 ng/L    Comment: (NOTE) Biotin concentrations > 1000 ng/mL falsely decrease TnT results.  Serial cardiac troponin measurements are suggested.  Refer to the Links section for chest pain algorithms and additional  guidance. Performed at [facility], [address] [place], [place] [address]   Hepatic function panel     Status: Abnormal   Collection Time: 04/09/24 11:44 PM  Result Value Ref Range   Total Protein 7.2 6.5 - 8.1 g/dL   Albumin 4.8 3.5 - 5.0 g/dL   AST 15 15 - 41 U/L   ALT 8 0 - 44 U/L   Alkaline Phosphatase 55 38 - 126 U/L   Total Bilirubin 0.7 0.0 - 1.2 mg/dL   Bilirubin, Direct 0.3 (H) 0.0 - 0.2 mg/dL   Indirect Bilirubin 0.4 0.3 - 0.9 mg/dL    Comment: Performed at [facility], [address] [place], [place] [address]  Lipase, blood     Status: Abnormal   Collection Time: 04/09/24 11:44 PM  Result Value Ref Range   Lipase 60 (H) 11 - 51 U/L    Comment: Performed at [facility], [address] [place], [place] [address]  hCG, serum, qualitative     Status: None   Collection Time: 04/10/24 12:07 AM  Result Value Ref Range   Preg, Serum NEGATIVE NEGATIVE    Comment:        THE SENSITIVITY OF THIS METHODOLOGY IS >10 mIU/mL. Performed at [facility], [address] [place], [place] [address]   Troponin T, High Sensitivity     Status: None   Collection Time: 04/10/24  1:30 AM  Result Value Ref Range   Troponin T High Sensitivity <15 0 - 19 ng/L    Comment: (NOTE) Biotin concentrations > 1000 ng/mL falsely decrease TnT results.  Serial cardiac troponin measurements are suggested.  Refer to the Links section for chest pain algorithms and additional  guidance. Performed at [facility], [address] [place], [place] [address]    CT ABDOMEN PELVIS W CONTRAST Result  Date: 04/10/2024 EXAM: CT ABDOMEN AND PELVIS WITH CONTRAST 04/10/2024 01:03:30 AM TECHNIQUE: CT of the abdomen and pelvis was performed with the administration of 100  mL of iohexol (OMNIPAQUE) 300 MG/ML solution. Multiplanar reformatted images are provided for review. Automated exposure control, iterative reconstruction, and/or weight-based adjustment of the mA/kV was utilized to reduce the radiation dose to as low as reasonably achievable. COMPARISON: None available. CLINICAL HISTORY: Abdominal pain, acute, nonlocalized FINDINGS: LOWER CHEST: No acute abnormality. LIVER: Mild intrahepatic biliary dilatation. GALLBLADDER AND BILE DUCTS: Common bile duct dilated up to 12 mm. Gallstones in gallbladder neck. Distended gallbladder with wall thickening and pericholecystic fluid, concerning for acute cholecystitis. SPLEEN: No acute abnormality. PANCREAS: No acute abnormality. ADRENAL GLANDS: No acute abnormality. KIDNEYS, URETERS AND BLADDER: Absent left kidney. No stones in the right kidney or ureter. No hydronephrosis of the right kidney. No perinephric or periureteral stranding. Urinary bladder is unremarkable. GI AND BOWEL: Stomach demonstrates no acute abnormality. There is no bowel obstruction. PERITONEUM AND RETROPERITONEUM: No ascites. No free air. VASCULATURE: Aorta is normal in caliber. LYMPH NODES: No lymphadenopathy. REPRODUCTIVE ORGANS: Heterogeneous uterus with multiple fibroids measuring up to 2.5 cm. BONES AND SOFT TISSUES: No acute osseous abnormality. No focal soft tissue abnormality. IMPRESSION: 1. Acute cholecystitis with gallstones in the gallbladder neck. 2. Mild intrahepatic biliary dilatation with common bile duct measuring up to 12 mm. No choledocholithiasis on CT. 3. Absent left kidney. Electronically signed by: Pinkie Pebbles MD 04/10/2024 01:08 AM EST RP Workstation: [id number]   DG Chest 2 View Result Date: 04/10/2024 EXAM: 2 VIEW(S) XRAY OF THE CHEST 04/10/2024 12:43:00 AM COMPARISON:  01/08/2006 CLINICAL HISTORY: CP FINDINGS: LUNGS AND PLEURA: No focal pulmonary opacity. No pleural effusion. No pneumothorax. HEART AND MEDIASTINUM: No acute abnormality of the cardiac and mediastinal silhouettes. BONES AND SOFT TISSUES: No acute osseous abnormality. IMPRESSION: 1. No acute cardiopulmonary process identified. Electronically signed by: Oneil Devonshire MD 04/10/2024 12:47 AM EST RP Workstation: [id number]    Anti-infectives (From admission, onward)    Start     Dose/Rate Route Frequency Ordered Stop   04/10/24 0115  metroNIDAZOLE (FLAGYL) IVPB 500 mg        500 mg 100 mL/hr over 60 Minutes Intravenous  Once 04/10/24 0106 04/10/24 0236   04/10/24 0115  ciprofloxacin (CIPRO) IVPB 400 mg        400 mg 200 mL/hr over 60 Minutes Intravenous  Once 04/10/24 0106 04/10/24 0236       Assessment/Plan Acute Cholecystitis - CT w/ Acute cholecystitis with gallstones in the gallbladder neck and Mild intrahepatic biliary dilatation with common bile duct measuring up to 12 mm. No choledocholithiasis on CT. - WBC wnl - Lipase 60 - LFT's wnl - Agree with abx, continue - Recommend OR for Laparoscopic Cholecystectomy with possible IOC. I have explained the procedure, risks, and aftercare of Laparoscopic cholecystectomy with possible IOC.  Risks include but are not limited to anesthesia (MI, CVA, death, prolonged intubation and aspiration), bleeding, infection, wound problems, hernia, bile leak, injury to common bile duct/liver/intestine, possible need for subtotal cholecystectomy or open cholecystectomy, increased risk of DVT/PE and diarrhea post op.  She seems to understand and agrees to proceed.  FEN - NPO VTE - SCDs ID - Cipro/Flagyl (PCN allergy) Foley - None currently Dispo - To OR. Possible d/c from PACU post op.   I reviewed nursing notes, ED provider notes, last 24 h vitals and pain scores, last 48 h intake and output, last 24 h labs and trends, and last 24 h imaging  results.   Ozell CHRISTELLA Shaper, [facility] 04/10/2024, 11:13 AM Please see Amion for pager number during day hours  7:00am-4:30pm

## 2024-04-10 NOTE — ED Notes (Signed)
 Patient transported to CT

## 2024-04-10 NOTE — ED Provider Notes (Signed)
 [place] [facility] Provider Note   CSN: [id number] Arrival date & time: 04/09/24  2325     Patient presents with: Chest Pain   Tammy Richard is a 48 y.o. female.   Here with severe abdominal pain. Has had intermittent episodes of abdominal pain over the last month but always improved. Tonight it did not. It is in the epigastric and ruq area and now it is radiating to the back and up to right shoulder as well. Emesis earlier, nauseous now. No urinary symptoms. No diarrhea/constipation. No known biliary issues.    Chest Pain      Prior to Admission medications   Medication Sig Start Date End Date Taking? Authorizing Provider  amphetamine-dextroamphetamine (ADDERALL XR) 20 MG 24 hr capsule Take by mouth. Patient not taking: Reported on 02/16/2021 09/24/19   [provider]  ANUCORT-HC  25 MG suppository Place 25 mg rectally 2 (two) times daily. 09/15/20   [provider]  Cetirizine HCl 10 MG CAPS Take by mouth. Patient not taking: Reported on 02/16/2021 11/15/13   [provider]  diltiazem  (CARDIZEM  SR) 60 MG 12 hr capsule Take 1 capsule (60 mg total) by mouth 2 (two) times daily as needed. 02/16/21   Walker, Caitlin S, NP  diphenhydrAMINE (BENADRYL) 12.5 MG chewable tablet Chew by mouth.    [provider]  EPINEPHrine  (AUVI-Q ) 0.3 mg/0.3 mL IJ SOAJ injection Use as directed for severe allergic reaction. 05/10/18   Cari Arlean HERO, FNP  EPINEPHrine  0.3 mg/0.3 mL IJ SOAJ injection Inject into the muscle once.    [provider]  FLUoxetine (PROZAC) 20 MG capsule Take 20 mg by mouth daily. 09/15/20   [provider]  hydrocortisone  cream 1 % Apply twice a day to red itchy areas on face. 04/19/16   Asa Aloysius LABOR, MD  hydrOXYzine  (ATARAX /VISTARIL ) 25 MG tablet Take 1 tablet (25 mg total) by mouth 3 (three) times daily as needed for itching. Patient not taking: Reported on 02/16/2021 04/24/17    Cari Arlean HERO, FNP  methylphenidate 27 MG PO CR tablet Take 27 mg by mouth every morning. 11/13/20   [provider]  methylphenidate 27 MG PO CR tablet Take by mouth.    [provider]  methylphenidate 36 MG PO CR tablet Take 36 mg by mouth every morning. 01/19/21   [provider]  omalizumab  (XOLAIR ) 150 MG injection Inject 300 mg into the skin every 28 (twenty-eight) days. Patient not taking: Reported on 02/16/2021 09/16/15   Asa Aloysius LABOR, MD  ranitidine  (ZANTAC ) 150 MG tablet Take 1 tablet (150 mg total) by mouth 2 (two) times daily. Patient not taking: Reported on 02/16/2021 04/24/17   Cari Arlean HERO, FNP  sertraline (ZOLOFT) 50 MG tablet Take 50 mg by mouth daily. Patient not taking: Reported on 02/16/2021 06/25/19   [provider]  triamcinolone  cream (KENALOG ) 0.1 % Apply twice a day if needed to red itchy areas below the face. 05/10/18   Cari Arlean HERO, FNP  valACYclovir (VALTREX) 1000 MG tablet  09/10/15   [provider]    Allergies: Codeine, Ibuprofen, Nsaids, Penicillins, and Sulfa antibiotics    Review of Systems  Cardiovascular:  Positive for chest pain.    Updated Vital Signs BP 117/75   Pulse 78   Temp 98.7 F (37.1 C) (Oral)   Resp 17   Ht 5' 6 (1.676 m)   Wt 63.5 kg   LMP 03/25/2024 (Approximate)  SpO2 100%   BMI 22.60 kg/m   Physical Exam Vitals and nursing note reviewed.  Constitutional:      Appearance: She is well-developed.  HENT:     Head: Normocephalic and atraumatic.  Cardiovascular:     Rate and Rhythm: Normal rate and regular rhythm.  Pulmonary:     Effort: No respiratory distress.     Breath sounds: No stridor.  Abdominal:     General: There is no distension.     Comments: Epigastric and ruq ttp  Musculoskeletal:     Cervical back: Normal range of motion.  Neurological:     Mental Status: She is alert.     (all labs ordered are listed, but only abnormal results are displayed) Labs Reviewed   BASIC METABOLIC PANEL WITH GFR - Abnormal; Notable for the following components:      Result Value   Glucose, Bld 101 (*)    Calcium 10.5 (*)    All other components within normal limits  HEPATIC FUNCTION PANEL - Abnormal; Notable for the following components:   Bilirubin, Direct 0.3 (*)    All other components within normal limits  LIPASE, BLOOD - Abnormal; Notable for the following components:   Lipase 60 (*)    All other components within normal limits  CBC  HCG, SERUM, QUALITATIVE  TROPONIN T, HIGH SENSITIVITY  TROPONIN T, HIGH SENSITIVITY    EKG: EKG Interpretation Date/Time:  Tuesday April 09 2024 23:33:48 EST Ventricular Rate:  98 PR Interval:  149 QRS Duration:  90 QT Interval:  343 QTC Calculation: 438 R Axis:   82  Text Interpretation: Sinus rhythm Low voltage, precordial leads RSR' in V1 or V2, right VCD or RVH Baseline wander in lead(s) II aVF V5 Confirmed by Lorette Mayo [phone]) on 04/10/2024 12:53:20 AM  Radiology: CT ABDOMEN PELVIS W CONTRAST Result Date: 04/10/2024 EXAM: CT ABDOMEN AND PELVIS WITH CONTRAST 04/10/2024 01:03:30 AM TECHNIQUE: CT of the abdomen and pelvis was performed with the administration of 100 mL of iohexol (OMNIPAQUE) 300 MG/ML solution. Multiplanar reformatted images are provided for review. Automated exposure control, iterative reconstruction, and/or weight-based adjustment of the mA/kV was utilized to reduce the radiation dose to as low as reasonably achievable. COMPARISON: None available. CLINICAL HISTORY: Abdominal pain, acute, nonlocalized FINDINGS: LOWER CHEST: No acute abnormality. LIVER: Mild intrahepatic biliary dilatation. GALLBLADDER AND BILE DUCTS: Common bile duct dilated up to 12 mm. Gallstones in gallbladder neck. Distended gallbladder with wall thickening and pericholecystic fluid, concerning for acute cholecystitis. SPLEEN: No acute abnormality. PANCREAS: No acute abnormality. ADRENAL GLANDS: No acute abnormality. KIDNEYS,  URETERS AND BLADDER: Absent left kidney. No stones in the right kidney or ureter. No hydronephrosis of the right kidney. No perinephric or periureteral stranding. Urinary bladder is unremarkable. GI AND BOWEL: Stomach demonstrates no acute abnormality. There is no bowel obstruction. PERITONEUM AND RETROPERITONEUM: No ascites. No free air. VASCULATURE: Aorta is normal in caliber. LYMPH NODES: No lymphadenopathy. REPRODUCTIVE ORGANS: Heterogeneous uterus with multiple fibroids measuring up to 2.5 cm. BONES AND SOFT TISSUES: No acute osseous abnormality. No focal soft tissue abnormality. IMPRESSION: 1. Acute cholecystitis with gallstones in the gallbladder neck. 2. Mild intrahepatic biliary dilatation with common bile duct measuring up to 12 mm. No choledocholithiasis on CT. 3. Absent left kidney. Electronically signed by: Pinkie Pebbles MD 04/10/2024 01:08 AM EST RP Workstation: [id number]   DG Chest 2 View Result Date: 04/10/2024 EXAM: 2 VIEW(S) XRAY OF THE CHEST 04/10/2024 12:43:00 AM COMPARISON: 01/08/2006 CLINICAL HISTORY: CP FINDINGS: LUNGS  AND PLEURA: No focal pulmonary opacity. No pleural effusion. No pneumothorax. HEART AND MEDIASTINUM: No acute abnormality of the cardiac and mediastinal silhouettes. BONES AND SOFT TISSUES: No acute osseous abnormality. IMPRESSION: 1. No acute cardiopulmonary process identified. Electronically signed by: Oneil Devonshire MD 04/10/2024 12:47 AM EST RP Workstation: [id number]     Procedures   Medications Ordered in the ED  lactated ringers infusion ( Intravenous New Bag/Given 04/10/24 0242)  HYDROmorphone (DILAUDID) injection 1 mg (1 mg Intravenous Given 04/09/24 2357)  ondansetron (ZOFRAN) injection 4 mg (4 mg Intravenous Given 04/09/24 2357)  lactated ringers bolus 1,000 mL (0 mLs Intravenous Stopped 04/10/24 0124)  iohexol (OMNIPAQUE) 300 MG/ML solution 100 mL (100 mLs Intravenous Contrast Given 04/10/24 0053)  metroNIDAZOLE (FLAGYL) IVPB 500 mg (0 mg Intravenous  Stopped 04/10/24 0236)  ciprofloxacin (CIPRO) IVPB 400 mg (0 mg Intravenous Stopped 04/10/24 0236)  HYDROmorphone (DILAUDID) injection 0.5 mg (0.5 mg Intravenous Given 04/10/24 0140)    Clinical Course as of 04/10/24 0617  Wed Apr 10, 2024  0104 Lipase(!): 60 [JM]  0105 CT ABDOMEN PELVIS W CONTRAST Viewed and interpreted by myself with signs of likely acute cholecystitis. Pain improved. Will add on abx. NPO. Will talk to surgery after rads read.  [JM]    Clinical Course User Index [JM] Olivine Hiers, Selinda, MD                                 Medical Decision Making Amount and/or Complexity of Data Reviewed Labs: ordered. Decision-making details documented in ED Course. Radiology: ordered. Decision-making details documented in ED Course.  Risk Prescription drug management. Decision regarding hospitalization.   Pretty straight forward presentation for cholecysitits confirmed on ct scan. Chest w/u reassuring, low suspicion for cardiac issues or PE. Pain controlled. NPO. No blood thinners. D/w Dr. Polly who agrees and will plan for surgery later in AM.      Final diagnoses:  None    ED Discharge Orders     None          Eugina Row, Selinda, MD 04/10/24 [phone]

## 2024-04-10 NOTE — Plan of Care (Signed)

## 2024-04-10 NOTE — ED Notes (Signed)
 Patient transported to X-ray

## 2024-04-10 NOTE — ED Notes (Signed)
 No distress, family member supportive at the bedside.  Pt ambulates to the bathroom to void.  Updated pt that we are waiting on transport.

## 2024-04-10 NOTE — Anesthesia Preprocedure Evaluation (Addendum)
 Anesthesia Evaluation  Patient identified by MRN, date of birth, ID band Patient awake    Reviewed: Allergy & Precautions, NPO status , Patient's Chart, lab work & pertinent test results  Airway Mallampati: I  TM Distance: >3 FB Neck ROM: Full    Dental no notable dental hx. (+) Teeth Intact, Dental Advisory Given   Pulmonary neg pulmonary ROS   Pulmonary exam normal breath sounds clear to auscultation       Cardiovascular negative cardio ROS Normal cardiovascular exam Rhythm:Regular Rate:Normal     Neuro/Psych  PSYCHIATRIC DISORDERS Anxiety Depression    negative neurological ROS     GI/Hepatic negative GI ROS, Neg liver ROS,,,  Endo/Other  negative endocrine ROS    Renal/GU negative Renal ROS  negative genitourinary   Musculoskeletal negative musculoskeletal ROS (+)    Abdominal   Peds  (+) ADHD Hematology negative hematology ROS (+)   Anesthesia Other Findings   Reproductive/Obstetrics                              Anesthesia Physical Anesthesia Plan  ASA: 2  Anesthesia Plan: General   Post-op Pain Management: Tylenol PO (pre-op)* and Precedex   Induction: Intravenous and Rapid sequence  PONV Risk Score and Plan: 3 and Midazolam, Dexamethasone and Ondansetron  Airway Management Planned: Oral ETT  Additional Equipment:   Intra-op Plan:   Post-operative Plan: Extubation in OR  Informed Consent: I have reviewed the patients History and Physical, chart, labs and discussed the procedure including the risks, benefits and alternatives for the proposed anesthesia with the patient or authorized representative who has indicated his/her understanding and acceptance.     Dental advisory given  Plan Discussed with: CRNA  Anesthesia Plan Comments:          Anesthesia Quick Evaluation

## 2024-04-10 NOTE — ED Provider Notes (Signed)
 I was updated by general surgery that they want patient to go to [facility] Pre[facility] rather than [facility] to help get a speedier case. [facility] will be updated.   Freddi Hamilton, MD 04/10/24 [phone]

## 2024-04-10 NOTE — Transfer of Care (Signed)
 Immediate Anesthesia Transfer of Care Note  Patient: Asher CROME Hegeman  Procedure(s) Performed: LAPAROSCOPIC CHOLECYSTECTOMY  Patient Location: PACU  Anesthesia Type:General  Level of Consciousness: awake, alert , and oriented  Airway & Oxygen Therapy: Patient Spontanous Breathing and Patient connected to face mask oxygen  Post-op Assessment: Report given to RN and Post -op Vital signs reviewed and stable  Post vital signs: Reviewed and stable  Last Vitals:  Vitals Value Taken Time  BP 123/74 04/10/24 16:41  Temp 36.5 C 04/10/24 15:48  Pulse 93 04/10/24 16:43  Resp 18 04/10/24 16:43  SpO2 100 % 04/10/24 16:43  Vitals shown include unfiled device data.  Last Pain:  Vitals:   04/10/24 1618  TempSrc:   PainSc: 8       Patients Stated Pain Goal: 2 (04/10/24 1327)  Complications: No notable events documented.

## 2024-04-10 NOTE — Op Note (Signed)
 Preoperative diagnosis: acute cholecystitis Postoperative diagnosis acute cholecystitis Procedure: Laparoscopic cholecystectomy Surgeon: Dr. Adina Bury Asst: Burnard Louder, PA-C Anesthesia: General  Complications: None Drains: None Estimated blood loss: Minimal Specimens: Gallbladder and contents to pathology Sponge count was correct at completion Disposition recovery stable   Indications:48 yo healthy female with ruq pain, murphys sign and imaging consistent with acute cholecystitis.  CBD enlarged but LFTs all normal. Discussed proceeding with lap chole.    Procedure: After informed consent was obtained she was taken to the operating room.  She was given antibiotics.  SCDs were placed.  She was placed under anesthesia without complication.  She was prepped and draped in a standard sterile surgical fashion.  A surgical timeout was then performed.   I infiltrated marcaine below the umbilicus and made a vertical incision. I then grasped the fascia and incised it.  I entered the peritoneum bluntly and placed a 0 vicryl pursestring suture.  I then inserted a hasson trocar and insufflated the abdomen to 15 mm Hg pressure. I then inserted three additional 5 mm trocars in the epigastrium and right side of the abdomen. The gallbladder was then retracted cephalad and lateral.  I was then able to dissect the triangle and clearly obtain a critical view of safety.  I confirmed my anatomy with the ICG dye as I saw the common duct and the cystic duct. I took the gallbladder off the liver bed for some distance just to confirm this. I then divided the artery after placing clips leaving two in place.  I then clipped the duct with three clips. I divided the duct leaving two in place. The clips completely traversed the duct and the duct was viable.the gallbladder was then removed from the liver bed.  The gallbladder was then placed in a retrieval bag.  I obtained hemostasis. I then removed the gallbladder in the  retrieval bag.  The umbilical  trocar was removed.I tied my pursestring down and then placed an additional 0 vicryl suture to completely obliterate the defect.  I then removed the remaining trocars and desufflated the abdomen.  These were closed with 4-0 Monocryl and glue.  She tolerated this well was extubated and transferred recovery stable

## 2024-04-10 NOTE — Discharge Instructions (Signed)

## 2024-04-10 NOTE — Anesthesia Procedure Notes (Signed)
 Procedure Name: Intubation Date/Time: 04/10/2024 2:52 PM  Performed by: Mannie Krystal LABOR, CRNAPre-anesthesia Checklist: Patient identified, Emergency Drugs available, Suction available and Patient being monitored Patient Re-evaluated:Patient Re-evaluated prior to induction Oxygen Delivery Method: Circle system utilized Preoxygenation: Pre-oxygenation with 100% oxygen Induction Type: IV induction and Rapid sequence Laryngoscope Size: Miller and 2 Grade View: Grade I Tube type: Oral Tube size: 7.0 mm Number of attempts: 1 Airway Equipment and Method: Stylet and Oral airway Placement Confirmation: ETT inserted through vocal cords under direct vision, positive ETCO2 and breath sounds checked- equal and bilateral Secured at: 22 cm Tube secured with: Tape Dental Injury: Teeth and Oropharynx as per pre-operative assessment

## 2024-04-11 ENCOUNTER — Encounter (HOSPITAL_COMMUNITY): Payer: Self-pay | Admitting: General Surgery

## 2024-04-11 ENCOUNTER — Inpatient Hospital Stay (HOSPITAL_COMMUNITY)

## 2024-04-11 DIAGNOSIS — Z9049 Acquired absence of other specified parts of digestive tract: Secondary | ICD-10-CM | POA: Diagnosis not present

## 2024-04-11 DIAGNOSIS — R748 Abnormal levels of other serum enzymes: Secondary | ICD-10-CM

## 2024-04-11 DIAGNOSIS — Z886 Allergy status to analgesic agent status: Secondary | ICD-10-CM | POA: Diagnosis not present

## 2024-04-11 DIAGNOSIS — K810 Acute cholecystitis: Secondary | ICD-10-CM | POA: Diagnosis present

## 2024-04-11 DIAGNOSIS — Z825 Family history of asthma and other chronic lower respiratory diseases: Secondary | ICD-10-CM | POA: Diagnosis not present

## 2024-04-11 DIAGNOSIS — K8062 Calculus of gallbladder and bile duct with acute cholecystitis without obstruction: Secondary | ICD-10-CM | POA: Diagnosis present

## 2024-04-11 DIAGNOSIS — Z880 Allergy status to penicillin: Secondary | ICD-10-CM | POA: Diagnosis not present

## 2024-04-11 DIAGNOSIS — R1010 Upper abdominal pain, unspecified: Secondary | ICD-10-CM | POA: Diagnosis not present

## 2024-04-11 DIAGNOSIS — Z882 Allergy status to sulfonamides status: Secondary | ICD-10-CM | POA: Diagnosis not present

## 2024-04-11 DIAGNOSIS — F419 Anxiety disorder, unspecified: Secondary | ICD-10-CM | POA: Diagnosis present

## 2024-04-11 DIAGNOSIS — K9186 Retained cholelithiasis following cholecystectomy: Secondary | ICD-10-CM | POA: Diagnosis not present

## 2024-04-11 DIAGNOSIS — Z885 Allergy status to narcotic agent status: Secondary | ICD-10-CM | POA: Diagnosis not present

## 2024-04-11 LAB — CBC
HCT: 34.6 % — ABNORMAL LOW (ref 36.0–46.0)
Hemoglobin: 12.1 g/dL (ref 12.0–15.0)
MCH: 31.3 pg (ref 26.0–34.0)
MCHC: 35 g/dL (ref 30.0–36.0)
MCV: 89.4 fL (ref 80.0–100.0)
Platelets: 224 K/uL (ref 150–400)
RBC: 3.87 MIL/uL (ref 3.87–5.11)
RDW: 12 % (ref 11.5–15.5)
WBC: 8.6 K/uL (ref 4.0–10.5)
nRBC: 0 % (ref 0.0–0.2)

## 2024-04-11 LAB — COMPREHENSIVE METABOLIC PANEL WITH GFR
ALT: 403 U/L — ABNORMAL HIGH (ref 0–44)
AST: 429 U/L — ABNORMAL HIGH (ref 15–41)
Albumin: 3.4 g/dL — ABNORMAL LOW (ref 3.5–5.0)
Alkaline Phosphatase: 86 U/L (ref 38–126)
Anion gap: 10 (ref 5–15)
BUN: 8 mg/dL (ref 6–20)
CO2: 22 mmol/L (ref 22–32)
Calcium: 8.8 mg/dL — ABNORMAL LOW (ref 8.9–10.3)
Chloride: 100 mmol/L (ref 98–111)
Creatinine, Ser: 0.91 mg/dL (ref 0.44–1.00)
GFR, Estimated: >60 mL/min (ref >60)
Glucose, Bld: 115 mg/dL — ABNORMAL HIGH (ref 70–99)
Potassium: 4.5 mmol/L (ref 3.5–5.1)
Sodium: 132 mmol/L — ABNORMAL LOW (ref 135–145)
Total Bilirubin: 3 mg/dL — ABNORMAL HIGH (ref 0.0–1.2)
Total Protein: 6.1 g/dL — ABNORMAL LOW (ref 6.5–8.1)

## 2024-04-11 MED ORDER — CIPROFLOXACIN IN D5W 400 MG/200ML IV SOLN: 400.0000 mg | INTRAVENOUS | Status: DC

## 2024-04-11 MED ORDER — LACTATED RINGERS IV SOLN: INTRAVENOUS | Status: AC

## 2024-04-11 MED ORDER — HYDROMORPHONE HCL 1 MG/ML IJ SOLN
1.0000 mg | INTRAMUSCULAR | Status: DC | PRN
  Administered 2024-04-11 – 2024-04-13 (×7): 1 mg via INTRAVENOUS
  Filled 2024-04-11 (×7): qty 1

## 2024-04-11 MED ORDER — GADOBUTROL 1 MMOL/ML IV SOLN
6.0000 mL | Freq: Once | INTRAVENOUS | Status: AC | PRN
  Administered 2024-04-11: 6 mL via INTRAVENOUS

## 2024-04-11 MED ORDER — HYDROMORPHONE HCL 1 MG/ML IJ SOLN
1.0000 mg | INTRAMUSCULAR | Status: DC | PRN
  Administered 2024-04-11 (×3): 1 mg via INTRAVENOUS
  Filled 2024-04-11 (×3): qty 1

## 2024-04-11 MED ORDER — LORAZEPAM 2 MG/ML IJ SOLN
0.5000 mg | Freq: Once | INTRAMUSCULAR | Status: AC
  Administered 2024-04-11: 0.5 mg via INTRAVENOUS
  Filled 2024-04-11: qty 1

## 2024-04-11 NOTE — Plan of Care (Signed)
 Patient calm and cooperative A&O X4, husband at bedside.Surgical incisions clean dry and intact. PRN nausea medications given to treat nausea. Patient request pain medications, on call doctor called, new orders placed. Patient ambulated to bathroom 3 times, stand by assist. Patient left with call bell in reach and bed in lowest position.  Problem: Education: Goal: Knowledge of General Education information will improve Description: Including pain rating scale, medication(s)/side effects and non-pharmacologic comfort measures Outcome: Progressing   Problem: Health Behavior/Discharge Planning: Goal: Ability to manage health-related needs will improve Outcome: Progressing   Problem: Clinical Measurements: Goal: Ability to maintain clinical measurements within normal limits will improve Outcome: Progressing   Problem: Activity: Goal: Risk for activity intolerance will decrease Outcome: Progressing   Problem: Nutrition: Goal: Adequate nutrition will be maintained Outcome: Progressing   Problem: Coping: Goal: Level of anxiety will decrease Outcome: Progressing   Problem: Elimination: Goal: Will not experience complications related to bowel motility Outcome: Progressing   Problem: Pain Managment: Goal: General experience of comfort will improve and/or be controlled Outcome: Progressing   Problem: Safety: Goal: Ability to remain free from injury will improve Outcome: Progressing

## 2024-04-11 NOTE — Progress Notes (Signed)
 Progress Note  1 Day Post-Op  Subjective: Pt reports epigastric abdominal pain this AM and some bilious emesis overnight. Husband at bedside. Reviewed AM labs and GI recommendation for MRCP today and they are in agreement.   Objective: Vital signs in last 24 hours: Temp:  [97.7 F (36.5 C)-99 F (37.2 C)] 98 F (36.7 C) (11/13 0805) Pulse Rate:  [62-105] 87 (11/13 0805) Resp:  [15-21] 18 (11/13 0805) BP: (103-141)/(56-93) 108/56 (11/13 0805) SpO2:  [95 %-100 %] 99 % (11/13 0805)    Intake/Output from previous day: 11/12 0701 - 11/13 0700 In: 1000 [I.V.:1000] Out: -  Intake/Output this shift: No intake/output data recorded.  PE: General: pleasant, WD, WN female who is laying in bed in NAD HEENT: sclera anicteric  Heart: regular, rate, and rhythm.   Lungs: Respiratory effort nonlabored Abd: soft, mild epigastric ttp, incisions C/D/I, ND Psych: A&Ox3 with an appropriate affect.    Lab Results:  Recent Labs    04/09/24 2336 04/11/24 0458  WBC 8.4 8.6  HGB 13.3 12.1  HCT 38.5 34.6*  PLT 261 224   BMET Recent Labs    04/09/24 2336 04/11/24 0458  NA 138 132*  K 3.7 4.5  CL 102 100  CO2 24 22  GLUCOSE 101* 115*  BUN 16 8  CREATININE 0.92 0.91  CALCIUM 10.5* 8.8*   PT/INR No results for input(s): LABPROT, INR in the last 72 hours. CMP     Component Value Date/Time   NA 132 (L) 04/11/2024 0458   K 4.5 04/11/2024 0458   CL 100 04/11/2024 0458   CO2 22 04/11/2024 0458   GLUCOSE 115 (H) 04/11/2024 0458   BUN 8 04/11/2024 0458   CREATININE 0.91 04/11/2024 0458   CALCIUM 8.8 (L) 04/11/2024 0458   PROT 6.1 (L) 04/11/2024 0458   ALBUMIN 3.4 (L) 04/11/2024 0458   AST 429 (H) 04/11/2024 0458   ALT 403 (H) 04/11/2024 0458   ALKPHOS 86 04/11/2024 0458   BILITOT 3.0 (H) 04/11/2024 0458   GFRNONAA >60 04/11/2024 0458   Lipase     Component Value Date/Time   LIPASE 60 (H) 04/09/2024 2344       Studies/Results: CT ABDOMEN PELVIS W  CONTRAST Result Date: 04/10/2024 EXAM: CT ABDOMEN AND PELVIS WITH CONTRAST 04/10/2024 01:03:30 AM TECHNIQUE: CT of the abdomen and pelvis was performed with the administration of 100 mL of iohexol (OMNIPAQUE) 300 MG/ML solution. Multiplanar reformatted images are provided for review. Automated exposure control, iterative reconstruction, and/or weight-based adjustment of the mA/kV was utilized to reduce the radiation dose to as low as reasonably achievable. COMPARISON: None available. CLINICAL HISTORY: Abdominal pain, acute, nonlocalized FINDINGS: LOWER CHEST: No acute abnormality. LIVER: Mild intrahepatic biliary dilatation. GALLBLADDER AND BILE DUCTS: Common bile duct dilated up to 12 mm. Gallstones in gallbladder neck. Distended gallbladder with wall thickening and pericholecystic fluid, concerning for acute cholecystitis. SPLEEN: No acute abnormality. PANCREAS: No acute abnormality. ADRENAL GLANDS: No acute abnormality. KIDNEYS, URETERS AND BLADDER: Absent left kidney. No stones in the right kidney or ureter. No hydronephrosis of the right kidney. No perinephric or periureteral stranding. Urinary bladder is unremarkable. GI AND BOWEL: Stomach demonstrates no acute abnormality. There is no bowel obstruction. PERITONEUM AND RETROPERITONEUM: No ascites. No free air. VASCULATURE: Aorta is normal in caliber. LYMPH NODES: No lymphadenopathy. REPRODUCTIVE ORGANS: Heterogeneous uterus with multiple fibroids measuring up to 2.5 cm. BONES AND SOFT TISSUES: No acute osseous abnormality. No focal soft tissue abnormality. IMPRESSION: 1. Acute cholecystitis with gallstones  in the gallbladder neck. 2. Mild intrahepatic biliary dilatation with common bile duct measuring up to 12 mm. No choledocholithiasis on CT. 3. Absent left kidney. Electronically signed by: Pinkie Pebbles MD 04/10/2024 01:08 AM EST RP Workstation: [id number]   DG Chest 2 View Result Date: 04/10/2024 EXAM: 2 VIEW(S) XRAY OF THE CHEST 04/10/2024  12:43:00 AM COMPARISON: 01/08/2006 CLINICAL HISTORY: CP FINDINGS: LUNGS AND PLEURA: No focal pulmonary opacity. No pleural effusion. No pneumothorax. HEART AND MEDIASTINUM: No acute abnormality of the cardiac and mediastinal silhouettes. BONES AND SOFT TISSUES: No acute osseous abnormality. IMPRESSION: 1. No acute cardiopulmonary process identified. Electronically signed by: Oneil Devonshire MD 04/10/2024 12:47 AM EST RP Workstation: [id number]    Anti-infectives: Anti-infectives (From admission, onward)    Start     Dose/Rate Route Frequency Ordered Stop   04/10/24 0115  metroNIDAZOLE (FLAGYL) IVPB 500 mg        500 mg 100 mL/hr over 60 Minutes Intravenous  Once 04/10/24 0106 04/10/24 0236   04/10/24 0115  ciprofloxacin (CIPRO) IVPB 400 mg        400 mg 200 mL/hr over 60 Minutes Intravenous  Once 04/10/24 0106 04/10/24 0236        Assessment/Plan  POD1 s/p laparoscopic cholecystectomy Dr. Ebbie - Pt with epigastric abdominal pain and nausea this AM - AST/ALT elevated to the 400s and Tbili 3.0 - both were normal pre-op - GI consulted, recommending MRCP and they will see today  - keep NPO this AM - pain control and prn antiemetics   FEN: NPO, LR@75  cc/h VTE: SQH ID: Cipro/flagyl pre-op    LOS: 0 days     Burnard JONELLE Louder, [facility] Surgery 04/11/2024, 8:37 AM Please see Amion for pager number during day hours 7:00am-4:30pm

## 2024-04-11 NOTE — Anesthesia Postprocedure Evaluation (Signed)
 Anesthesia Post Note  Patient: YUVAL NOLET  Procedure(s) Performed: LAPAROSCOPIC CHOLECYSTECTOMY     Patient location during evaluation: PACU Anesthesia Type: General Level of consciousness: awake and alert Pain management: pain level controlled Vital Signs Assessment: post-procedure vital signs reviewed and stable Respiratory status: spontaneous breathing, nonlabored ventilation, respiratory function stable and patient connected to nasal cannula oxygen Cardiovascular status: blood pressure returned to baseline and stable Postop Assessment: no apparent nausea or vomiting Anesthetic complications: no   No notable events documented.  Last Vitals:  Vitals:   04/11/24 0723 04/11/24 0805  BP: (!) 130/58 (!) 108/56  Pulse: 91 87  Resp: 18 18  Temp: 36.8 C 36.7 C  SpO2: 99% 99%    Last Pain:  Vitals:   04/11/24 1051  TempSrc:   PainSc: 4                  Harinder Romas L Captola Teschner

## 2024-04-11 NOTE — Consult Note (Addendum)
 Consultation Note   Referring Provider:   General Surgery PCP: Jeannetta Rollo HERO, MD Primary Gastroenterologist:  [facility]pson        Reason for Consultation: Possible bile duct stone DOA: 04/09/2024         Hospital Day: 3   ASSESSMENT    48 year old female admitted with abdominal pain , cholelithiasis, dilated bile duct with normal LFTs, and acute cholecystitis status post laparoscopic cholecystectomy without IOC but with indocyanine green dye 04/10/2024.  Now with new elevation in liver chemistries overnight concerning for choledocholithiasis.   Single kidney ( Right) Incidentally found out approximately 20 years ago that one of her kidneys had atrophied.  CT scan does show absent left kidney  Anxiety  See [facility] for any additional medical history  / medical problems  Principal Problem:   Acute cholecystitis Active Problems:   S/P laparoscopic cholecystectomy    PLAN:   --MRI/MRCP already ordered.  Depending on those results she may need ERCP.  The benefits and risks of ERCP with possible sphincterotomy not limited to cardiopulmonary complications of sedation, bleeding, infection, perforation,and pancreatitis were discussed with the patient who agrees to proceed.  Will place tentatively on the endoscopy schedule.  If MRCP is positive then we will need to hold subcu heparin after tonight --NPO after MN -- Has penicillin allergy.  So preoperatively will receive dose of Cipro.  -- Indocin suppositories not ordered to be given at time of ERCP given NSAID allergy   HPI   Brief History:   Patient presented to the ED yesterday with complaints of chest pain , abdominal pain and vomiting.  Found to have acute cholecystitis and admitted by General Surgery.  Biliary duct dilation was noted but her LFTs were normal so she was taken for laparoscopic cholecystectomy yesterday.  IOC was not done but ICG dye was used to confirm location of  common duct and the cystic duct. Overnight her she had a significant rise in her liver chemistries from being normal to AST 429, ALT 403, total bilirubin 3.  Alkaline phosphatase has remained normal at 86.  Admission labs notable for ulcers: normal CBC, lipase 60, normal LFTs, normal high-sensitivity troponin.    CT scan with contrast - cholelithiasis and acute cholecystitis.  Additionally there was mild intrahepatic biliary duct dilation with CBD measuring up to 12 mm  Today Cherye is having localize RUQ pain but otherwise feels okay.  She has no chronic GI problems.  PPIs on home med list but she has only been taking that recently because she thought the upper abdominal pain might be GERD.   Pertinent GI Studies   Colonoscopy June 2022 -General Surgery 1 sessile, adenomatous appearing polyp measuring 10 mm or larger in the cecum removed by piecemeal.  Multiple small diverticula in the sigmoid colon.  Labs and Imaging:  Recent Labs    04/09/24 2344 04/11/24 0458  PROT 7.2 6.1*  ALBUMIN 4.8 3.4*  AST 15 429*  ALT 8 403*  ALKPHOS 55 86  BILITOT 0.7 3.0*  BILIDIR 0.3*  --   IBILI 0.4  --    Recent Labs    04/09/24 2336 04/11/24 0458  WBC 8.4 8.6  HGB 13.3 12.1  HCT 38.5 34.6*  MCV  89.5 89.4  PLT 261 224   Recent Labs    04/09/24 2336 04/11/24 0458  NA 138 132*  K 3.7 4.5  CL 102 100  CO2 24 22  GLUCOSE 101* 115*  BUN 16 8  CREATININE 0.92 0.91  CALCIUM 10.5* 8.8*     CT ABDOMEN PELVIS W CONTRAST EXAM: CT ABDOMEN AND PELVIS WITH CONTRAST 04/10/2024 01:03:30 AM  TECHNIQUE: CT of the abdomen and pelvis was performed with the administration of 100 mL of iohexol (OMNIPAQUE) 300 MG/ML solution. Multiplanar reformatted images are provided for review. Automated exposure control, iterative reconstruction, and/or weight-based adjustment of the mA/kV was utilized to reduce the radiation dose to as low as reasonably achievable.  COMPARISON: None  available.  CLINICAL HISTORY: Abdominal pain, acute, nonlocalized  FINDINGS:  LOWER CHEST: No acute abnormality.  LIVER: Mild intrahepatic biliary dilatation.  GALLBLADDER AND BILE DUCTS: Common bile duct dilated up to 12 mm. Gallstones in gallbladder neck. Distended gallbladder with wall thickening and pericholecystic fluid, concerning for acute cholecystitis.  SPLEEN: No acute abnormality.  PANCREAS: No acute abnormality.  ADRENAL GLANDS: No acute abnormality.  KIDNEYS, URETERS AND BLADDER: Absent left kidney. No stones in the right kidney or ureter. No hydronephrosis of the right kidney. No perinephric or periureteral stranding. Urinary bladder is unremarkable.  GI AND BOWEL: Stomach demonstrates no acute abnormality. There is no bowel obstruction.  PERITONEUM AND RETROPERITONEUM: No ascites. No free air.  VASCULATURE: Aorta is normal in caliber.  LYMPH NODES: No lymphadenopathy.  REPRODUCTIVE ORGANS: Heterogeneous uterus with multiple fibroids measuring up to 2.5 cm.  BONES AND SOFT TISSUES: No acute osseous abnormality. No focal soft tissue abnormality.  IMPRESSION: 1. Acute cholecystitis with gallstones in the gallbladder neck. 2. Mild intrahepatic biliary dilatation with common bile duct measuring up to 12 mm. No choledocholithiasis on CT. 3. Absent left kidney.  Electronically signed by: Pinkie Pebbles MD 04/10/2024 01:08 AM EST RP Workstation: [id number] DG Chest 2 View EXAM: 2 VIEW(S) XRAY OF THE CHEST 04/10/2024 12:43:00 AM  COMPARISON: 01/08/2006  CLINICAL HISTORY: CP  FINDINGS:  LUNGS AND PLEURA: No focal pulmonary opacity. No pleural effusion. No pneumothorax.  HEART AND MEDIASTINUM: No acute abnormality of the cardiac and mediastinal silhouettes.  BONES AND SOFT TISSUES: No acute osseous abnormality.  IMPRESSION: 1. No acute cardiopulmonary process identified.  Electronically signed by: Oneil Devonshire MD 04/10/2024 12:47  AM EST RP Workstation: [id number]     Past Medical History:  Diagnosis Date   ADHD (attention deficit hyperactivity disorder)    Urticaria     Past Surgical History:  Procedure Laterality Date   APPENDECTOMY     CHOLECYSTECTOMY N/A 04/10/2024   Procedure: LAPAROSCOPIC CHOLECYSTECTOMY;  Surgeon: Ebbie Cough, MD;  Location: [facility] OR;  Service: General;  Laterality: N/A;  WITH ICG DYE    Family History  Problem Relation Age of Onset   COPD Mother    Emphysema Mother    Allergic rhinitis Neg Hx    Angioedema Neg Hx    Asthma Neg Hx    Eczema Neg Hx    Immunodeficiency Neg Hx    Urticaria Neg Hx     Prior to Admission medications   Medication Sig Start Date End Date Taking? Authorizing Provider  acetaminophen (TYLENOL) 500 MG tablet Take 500 mg by mouth as needed for headache.   Yes [provider]  calcium carbonate (TUMS - DOSED IN MG ELEMENTAL CALCIUM) 500 MG chewable tablet Chew 1 tablet by mouth daily. *gummies*   Yes  [provider]  omeprazole (PRILOSEC) 20 MG capsule Take 20 mg by mouth daily.   Yes [provider]    Current Facility-Administered Medications  Medication Dose Route Frequency Provider Last Rate Last Admin   acetaminophen (TYLENOL) tablet 1,000 mg  1,000 mg Oral Q6H Ebbie Cough, MD   1,000 mg at 04/11/24 0843   heparin injection 5,000 Units  5,000 Units Subcutaneous Q8H Wakefield, Matthew, MD   5,000 Units at 04/11/24 9383   HYDROmorphone (DILAUDID) injection 1 mg  1 mg Intravenous Q2H PRN Johnson, Kelly R, PA-C       lactated ringers infusion   Intravenous Continuous Vicci Burnard SAUNDERS, PA-C 75 mL/hr at 04/11/24 1027 New Bag at 04/11/24 1027   LORazepam (ATIVAN) injection 0.5 mg  0.5 mg Intravenous Once Vicci Burnard SAUNDERS, PA-C       methocarbamol  (ROBAXIN ) tablet 500 mg  500 mg Oral Q8H PRN Ebbie Cough, MD   500 mg at 04/10/24 1949   Or   methocarbamol  (ROBAXIN ) injection 500 mg  500 mg Intravenous Q8H PRN  Ebbie Cough, MD       ondansetron (ZOFRAN) injection 4 mg  4 mg Intravenous Q4H PRN Ebbie Cough, MD   4 mg at 04/11/24 9278   oxyCODONE (Oxy IR/ROXICODONE) immediate release tablet 5 mg  5 mg Oral Q4H PRN Ebbie Cough, MD   5 mg at 04/11/24 9156   simethicone (MYLICON) chewable tablet 40 mg  40 mg Oral Q6H PRN Ebbie Cough, MD        Allergies as of 04/09/2024 - Review Complete 04/09/2024  Allergen Reaction Noted   Codeine  09/15/2014   Ibuprofen  09/15/2014   Nsaids  04/09/2024   Penicillins  09/15/2014   Sulfa antibiotics Rash 11/15/2013    Social History   Socioeconomic History   Marital status: Married    Spouse name: Not on file   Number of children: Not on file   Years of education: Not on file   Highest education level: Not on file  Occupational History   Not on file  Tobacco Use   Smoking status: Never   Smokeless tobacco: Never  Vaping Use   Vaping status: Never Used  Substance and Sexual Activity   Alcohol use: No   Drug use: No   Sexual activity: Not on file  Other Topics Concern   Not on file  Social History Narrative    She lives at home with her husband and 3 sons.  She works in research scientist (life sciences).  She does not smoke cigarettes.   Social Drivers of Corporate Investment Banker Strain: Not on file  Food Insecurity: No Food Insecurity (04/10/2024)   Hunger Vital Sign    Worried About Running Out of Food in the Last Year: Never true    Ran Out of Food in the Last Year: Never true  Transportation Needs: No Transportation Needs (04/10/2024)   PRAPARE - Administrator, Civil Service (Medical): No    Lack of Transportation (Non-Medical): No  Physical Activity: Not on file  Stress: No Stress Concern Present (01/26/2021)   Received from [facility] of Occupational Health - Occupational Stress Questionnaire    Feeling of Stress : Only a little  Social Connections: Not on file  Intimate Partner  Violence: Not At Risk (04/10/2024)   Humiliation, Afraid, Rape, and Kick questionnaire    Fear of Current or Ex-Partner: No    Emotionally Abused: No  Physically Abused: No    Sexually Abused: No     Code Status   Code Status: Full Code  Review of Systems: All systems reviewed and negative except where noted in HPI.  Physical Exam: Vital signs in last 24 hours: Temp:  [97.7 F (36.5 C)-99 F (37.2 C)] 98 F (36.7 C) (11/13 0805) Pulse Rate:  [71-105] 87 (11/13 0805) Resp:  [15-21] 18 (11/13 0805) BP: (103-141)/(56-93) 108/56 (11/13 0805) SpO2:  [95 %-100 %] 99 % (11/13 0805)    General:  Pleasant female in NAD Psych:  Cooperative. Normal mood and affect Eyes: Pupils equal Ears:  Normal auditory acuity Nose: No deformity, discharge or lesions Neck:  Supple, no masses felt Lungs:  Clear to auscultation.  Heart:  Regular rate, regular rhythm.  Abdomen:  Soft, minimal RUQ tenderness, active bowel sounds, no masses felt Rectal :  Deferred Msk: Symmetrical without gross deformities.  Neurologic:  Alert, oriented, grossly normal neurologically Extremities : No edema Skin:  Intact without significant lesions.    Intake/Output from previous day: 11/12 0701 - 11/13 0700 In: 1000 [I.V.:1000] Out: -  Intake/Output this shift:  No intake/output data recorded.   Vina Dasen, NP-C   04/11/2024, 11:18 AM

## 2024-04-12 ENCOUNTER — Encounter (HOSPITAL_COMMUNITY): Admission: EM | Disposition: A | Discharge status: Home / Self Care | Payer: Self-pay | Source: Home / Self Care

## 2024-04-12 DIAGNOSIS — R1010 Upper abdominal pain, unspecified: Secondary | ICD-10-CM

## 2024-04-12 DIAGNOSIS — R748 Abnormal levels of other serum enzymes: Secondary | ICD-10-CM | POA: Diagnosis not present

## 2024-04-12 DIAGNOSIS — Z9049 Acquired absence of other specified parts of digestive tract: Secondary | ICD-10-CM | POA: Diagnosis not present

## 2024-04-12 LAB — COMPREHENSIVE METABOLIC PANEL WITH GFR
ALT: 316 U/L — ABNORMAL HIGH (ref 0–44)
AST: 143 U/L — ABNORMAL HIGH (ref 15–41)
Albumin: 3.4 g/dL — ABNORMAL LOW (ref 3.5–5.0)
Alkaline Phosphatase: 98 U/L (ref 38–126)
Anion gap: 8 (ref 5–15)
BUN: 7 mg/dL (ref 6–20)
CO2: 24 mmol/L (ref 22–32)
Calcium: 8.4 mg/dL — ABNORMAL LOW (ref 8.9–10.3)
Chloride: 98 mmol/L (ref 98–111)
Creatinine, Ser: 0.84 mg/dL (ref 0.44–1.00)
GFR, Estimated: >60 mL/min (ref >60)
Glucose, Bld: 83 mg/dL (ref 70–99)
Potassium: 4.2 mmol/L (ref 3.5–5.1)
Sodium: 130 mmol/L — ABNORMAL LOW (ref 135–145)
Total Bilirubin: 1.2 mg/dL (ref 0.0–1.2)
Total Protein: 5.6 g/dL — ABNORMAL LOW (ref 6.5–8.1)

## 2024-04-12 LAB — SURGICAL PATHOLOGY

## 2024-04-12 LAB — LIPASE, BLOOD: Lipase: 29 U/L (ref 11–51)

## 2024-04-12 SURGERY — ERCP, WITH INTERVENTION IF INDICATED
Anesthesia: General

## 2024-04-12 MED ORDER — DICYCLOMINE HCL 20 MG PO TABS
20.0000 mg | ORAL_TABLET | Freq: Three times a day (TID) | ORAL | Status: DC
  Administered 2024-04-12 – 2024-04-13 (×3): 20 mg via ORAL
  Filled 2024-04-12 (×4): qty 1

## 2024-04-12 NOTE — Progress Notes (Signed)
 Progress Note   Subjective  Patient still having upper abdominal pain, perhaps not as bad as yesterday. MRCP negative for retained stone, LAEs improving.   Objective   Vital signs in last 24 hours: Temp:  [98.2 F (36.8 C)-98.7 F (37.1 C)] 98.7 F (37.1 C) (11/14 1152) Pulse Rate:  [77-87] 86 (11/14 1152) Resp:  [16-18] 16 (11/14 1152) BP: (111-125)/(55-76) 111/76 (11/14 1152) SpO2:  [100 %] 100 % (11/14 1152) Last BM Date : 02/07/24 General:    white female in NAD Neurologic:  Alert and oriented,  grossly normal neurologically. Psych:  Cooperative. Normal mood and affect.  Intake/Output from previous day: 11/13 0701 - 11/14 0700 In: 480 [P.O.:480] Out: 250 [Urine:250] Intake/Output this shift: No intake/output data recorded.  Lab Results: Recent Labs    04/09/24 2336 04/11/24 0458  WBC 8.4 8.6  HGB 13.3 12.1  HCT 38.5 34.6*  PLT 261 224   BMET Recent Labs    04/09/24 2336 04/11/24 0458 04/12/24 0452  NA 138 132* 130*  K 3.7 4.5 4.2  CL 102 100 98  CO2 24 22 24   GLUCOSE 101* 115* 83  BUN 16 8 7   CREATININE 0.92 0.91 0.84  CALCIUM 10.5* 8.8* 8.4*   LFT Recent Labs    04/09/24 2344 04/11/24 0458 04/12/24 0452  PROT 7.2   < > 5.6*  ALBUMIN 4.8   < > 3.4*  AST 15   < > 143*  ALT 8   < > 316*  ALKPHOS 55   < > 98  BILITOT 0.7   < > 1.2  BILIDIR 0.3*  --   --   IBILI 0.4  --   --    < > = values in this interval not displayed.   PT/INR No results for input(s): LABPROT, INR in the last 72 hours.  Studies/Results: MR ABDOMEN MRCP W WO CONTAST Result Date: 04/11/2024 EXAM: MRCP WITHOUT AND WITH IV CONTRAST 04/11/2024 04:17:27 PM TECHNIQUE: Multisequence, multiplanar magnetic resonance images of the abdomen without and with intravenous contrast. MRCP sequences were performed. 6 mL (gadobutrol (GADAVIST) 1 MMOL/ML injection 6 mL GADOBUTROL 1 MMOL/ML IV SOLN). COMPARISON: CT of 1 day prior. CLINICAL HISTORY: Cholelithiasis; POD1 s/p lap  chole, concern for choledocholithiasis. FINDINGS: LIMITATIONS: Portions of exam are minimally motion degraded. LIVER: Unremarkable. GALLBLADDER AND BILIARY SYSTEM: Status post cholecystectomy. Minimal edema in the operative bed, without well defined fluid collection. Minimal intrahepatic biliary duct dilatation. Mild common duct dilatation including at 13 mm on image 19 / 3. No obstructive stone or mass identified. On the preoperative CT, the duct measured approximately 12 mm at the same level. SPLEEN: Unremarkable. PANCREAS/PANCREATIC DUCT: Visualized pancreas is unremarkable. No pancreatic ductal dilatation. ADRENAL GLANDS: Unremarkable. KIDNEYS: Markedly atrophic left kidney.  normal right kidney. LYMPH NODES: No enlarged abdominal lymph nodes. VASCULATURE: Aortic atherosclerosis. PERITONEUM: No ascites. ABDOMINAL WALL: No hernia. No mass. BOWEL: The stomach is fluid filled. Normal caliber of bowel loops. No bowel obstruction. BONES: No acute abnormality or worrisome osseous lesion. SOFT TISSUES: Unremarkable. MISCELLANEOUS: Unremarkable. IMPRESSION: 1. status post cholecystectomy, with mild intra and extrahepatic biliary duct dilatation.no obstructive stone or mass identified. The extent of biliary duct dilatation is relatively similar to preoperative CT. therefore, findings may relate to recent stone passage. if bilirubin remains elevated, ERCP should be considered. 2. No postoperative complication identified. 3. Fluid filled, mildly distended proximal stomach. consider nasogastric tube placement. Electronically signed by: Rockey Kilts MD 04/11/2024 06:25 PM EST RP  Workstation: [id number]   MR 3D Recon At Scanner Result Date: 04/11/2024 EXAM: MRCP WITHOUT AND WITH IV CONTRAST 04/11/2024 04:17:27 PM TECHNIQUE: Multisequence, multiplanar magnetic resonance images of the abdomen without and with intravenous contrast. MRCP sequences were performed. 6 mL (gadobutrol (GADAVIST) 1 MMOL/ML injection 6 mL GADOBUTROL  1 MMOL/ML IV SOLN). COMPARISON: CT of 1 day prior. CLINICAL HISTORY: Cholelithiasis; POD1 s/p lap chole, concern for choledocholithiasis. FINDINGS: LIMITATIONS: Portions of exam are minimally motion degraded. LIVER: Unremarkable. GALLBLADDER AND BILIARY SYSTEM: Status post cholecystectomy. Minimal edema in the operative bed, without well defined fluid collection. Minimal intrahepatic biliary duct dilatation. Mild common duct dilatation including at 13 mm on image 19 / 3. No obstructive stone or mass identified. On the preoperative CT, the duct measured approximately 12 mm at the same level. SPLEEN: Unremarkable. PANCREAS/PANCREATIC DUCT: Visualized pancreas is unremarkable. No pancreatic ductal dilatation. ADRENAL GLANDS: Unremarkable. KIDNEYS: Markedly atrophic left kidney.  normal right kidney. LYMPH NODES: No enlarged abdominal lymph nodes. VASCULATURE: Aortic atherosclerosis. PERITONEUM: No ascites. ABDOMINAL WALL: No hernia. No mass. BOWEL: The stomach is fluid filled. Normal caliber of bowel loops. No bowel obstruction. BONES: No acute abnormality or worrisome osseous lesion. SOFT TISSUES: Unremarkable. MISCELLANEOUS: Unremarkable. IMPRESSION: 1. status post cholecystectomy, with mild intra and extrahepatic biliary duct dilatation.no obstructive stone or mass identified. The extent of biliary duct dilatation is relatively similar to preoperative CT. therefore, findings may relate to recent stone passage. if bilirubin remains elevated, ERCP should be considered. 2. No postoperative complication identified. 3. Fluid filled, mildly distended proximal stomach. consider nasogastric tube placement. Electronically signed by: Rockey Kilts MD 04/11/2024 06:25 PM EST RP Workstation: [id number]       Assessment / Plan:    48 y/o female here with the following:  Upper abdominal pain post cholecystectomy Elevated liver enzymes - MRCP negative for choledocholithiasis  MRCP done - there is no evidence of a  retained stone / choledocholithiasis.  In this light, no plans for ERCP. Her liver enzymes acutely rose the day after surgery (previously normal), but have downtrended this morning, bilirubin normalized.  Lipase normal.  No evidence of pancreatitis on labs or imaging.   Most likely she probably passed a retained stone. Labs and imaging support that, however interestingly her pain persists albeit not as bad as yesterday.  Hopefully this will just improve with time.  Could try adding some Bentyl to treat any spastic component. Bile leak seems unlikely given improvement in liver enzymes.  Trend LFTs for now. If they continue to improve and her pain improves perhaps home tomorrow.   Dr. Rollin covering inpatient GI service tomorrow.  Marcey Naval, MD [facility]

## 2024-04-12 NOTE — Progress Notes (Signed)
 Progress Note  2 Days Post-Op  Subjective: Pt continues to have RUQ/epigastric abdominal pain that is intermittently severe with nausea and some emesis. Discussed MRCP and labs this AM. Await GI eval and decision on ERCP.   Objective: Vital signs in last 24 hours: Temp:  [98 F (36.7 C)-98.6 F (37 C)] 98.5 F (36.9 C) (11/14 0820) Pulse Rate:  [77-87] 80 (11/14 0820) Resp:  [16-18] 16 (11/14 0820) BP: (112-125)/(55-65) 120/55 (11/14 0820) SpO2:  [100 %] 100 % (11/14 0820) Last BM Date : 02/07/24  Intake/Output from previous day: 11/13 0701 - 11/14 0700 In: 480 [P.O.:480] Out: 250 [Urine:250] Intake/Output this shift: No intake/output data recorded.  PE: General: pleasant, WD, WN female who is laying in bed in NAD HEENT: sclera anicteric  Heart: regular, rate, and rhythm.   Lungs: Respiratory effort nonlabored Abd: soft, RUQ/epigastric ttp, incisions C/D/I, ND Psych: A&Ox3 with an appropriate affect.    Lab Results:  Recent Labs    04/09/24 2336 04/11/24 0458  WBC 8.4 8.6  HGB 13.3 12.1  HCT 38.5 34.6*  PLT 261 224   BMET Recent Labs    04/11/24 0458 04/12/24 0452  NA 132* 130*  K 4.5 4.2  CL 100 98  CO2 22 24  GLUCOSE 115* 83  BUN 8 7  CREATININE 0.91 0.84  CALCIUM 8.8* 8.4*   PT/INR No results for input(s): LABPROT, INR in the last 72 hours. CMP     Component Value Date/Time   NA 130 (L) 04/12/2024 0452   K 4.2 04/12/2024 0452   CL 98 04/12/2024 0452   CO2 24 04/12/2024 0452   GLUCOSE 83 04/12/2024 0452   BUN 7 04/12/2024 0452   CREATININE 0.84 04/12/2024 0452   CALCIUM 8.4 (L) 04/12/2024 0452   PROT 5.6 (L) 04/12/2024 0452   ALBUMIN 3.4 (L) 04/12/2024 0452   AST 143 (H) 04/12/2024 0452   ALT 316 (H) 04/12/2024 0452   ALKPHOS 98 04/12/2024 0452   BILITOT 1.2 04/12/2024 0452   GFRNONAA >60 04/12/2024 0452   Lipase     Component Value Date/Time   LIPASE 29 04/12/2024 0452       Studies/Results: MR ABDOMEN MRCP W WO  CONTAST Result Date: 04/11/2024 EXAM: MRCP WITHOUT AND WITH IV CONTRAST 04/11/2024 04:17:27 PM TECHNIQUE: Multisequence, multiplanar magnetic resonance images of the abdomen without and with intravenous contrast. MRCP sequences were performed. 6 mL (gadobutrol (GADAVIST) 1 MMOL/ML injection 6 mL GADOBUTROL 1 MMOL/ML IV SOLN). COMPARISON: CT of 1 day prior. CLINICAL HISTORY: Cholelithiasis; POD1 s/p lap chole, concern for choledocholithiasis. FINDINGS: LIMITATIONS: Portions of exam are minimally motion degraded. LIVER: Unremarkable. GALLBLADDER AND BILIARY SYSTEM: Status post cholecystectomy. Minimal edema in the operative bed, without well defined fluid collection. Minimal intrahepatic biliary duct dilatation. Mild common duct dilatation including at 13 mm on image 19 / 3. No obstructive stone or mass identified. On the preoperative CT, the duct measured approximately 12 mm at the same level. SPLEEN: Unremarkable. PANCREAS/PANCREATIC DUCT: Visualized pancreas is unremarkable. No pancreatic ductal dilatation. ADRENAL GLANDS: Unremarkable. KIDNEYS: Markedly atrophic left kidney.  normal right kidney. LYMPH NODES: No enlarged abdominal lymph nodes. VASCULATURE: Aortic atherosclerosis. PERITONEUM: No ascites. ABDOMINAL WALL: No hernia. No mass. BOWEL: The stomach is fluid filled. Normal caliber of bowel loops. No bowel obstruction. BONES: No acute abnormality or worrisome osseous lesion. SOFT TISSUES: Unremarkable. MISCELLANEOUS: Unremarkable. IMPRESSION: 1. status post cholecystectomy, with mild intra and extrahepatic biliary duct dilatation.no obstructive stone or mass identified. The extent of  biliary duct dilatation is relatively similar to preoperative CT. therefore, findings may relate to recent stone passage. if bilirubin remains elevated, ERCP should be considered. 2. No postoperative complication identified. 3. Fluid filled, mildly distended proximal stomach. consider nasogastric tube placement.  Electronically signed by: Rockey Kilts MD 04/11/2024 06:25 PM EST RP Workstation: [id number]   MR 3D Recon At Scanner Result Date: 04/11/2024 EXAM: MRCP WITHOUT AND WITH IV CONTRAST 04/11/2024 04:17:27 PM TECHNIQUE: Multisequence, multiplanar magnetic resonance images of the abdomen without and with intravenous contrast. MRCP sequences were performed. 6 mL (gadobutrol (GADAVIST) 1 MMOL/ML injection 6 mL GADOBUTROL 1 MMOL/ML IV SOLN). COMPARISON: CT of 1 day prior. CLINICAL HISTORY: Cholelithiasis; POD1 s/p lap chole, concern for choledocholithiasis. FINDINGS: LIMITATIONS: Portions of exam are minimally motion degraded. LIVER: Unremarkable. GALLBLADDER AND BILIARY SYSTEM: Status post cholecystectomy. Minimal edema in the operative bed, without well defined fluid collection. Minimal intrahepatic biliary duct dilatation. Mild common duct dilatation including at 13 mm on image 19 / 3. No obstructive stone or mass identified. On the preoperative CT, the duct measured approximately 12 mm at the same level. SPLEEN: Unremarkable. PANCREAS/PANCREATIC DUCT: Visualized pancreas is unremarkable. No pancreatic ductal dilatation. ADRENAL GLANDS: Unremarkable. KIDNEYS: Markedly atrophic left kidney.  normal right kidney. LYMPH NODES: No enlarged abdominal lymph nodes. VASCULATURE: Aortic atherosclerosis. PERITONEUM: No ascites. ABDOMINAL WALL: No hernia. No mass. BOWEL: The stomach is fluid filled. Normal caliber of bowel loops. No bowel obstruction. BONES: No acute abnormality or worrisome osseous lesion. SOFT TISSUES: Unremarkable. MISCELLANEOUS: Unremarkable. IMPRESSION: 1. status post cholecystectomy, with mild intra and extrahepatic biliary duct dilatation.no obstructive stone or mass identified. The extent of biliary duct dilatation is relatively similar to preoperative CT. therefore, findings may relate to recent stone passage. if bilirubin remains elevated, ERCP should be considered. 2. No postoperative complication  identified. 3. Fluid filled, mildly distended proximal stomach. consider nasogastric tube placement. Electronically signed by: Rockey Kilts MD 04/11/2024 06:25 PM EST RP Workstation: [id number]    Anti-infectives: Anti-infectives (From admission, onward)    Start     Dose/Rate Route Frequency Ordered Stop   04/12/24 0600  ciprofloxacin (CIPRO) IVPB 400 mg        400 mg 200 mL/hr over 60 Minutes Intravenous 30 min pre-op 04/11/24 1221     04/10/24 0115  metroNIDAZOLE (FLAGYL) IVPB 500 mg        500 mg 100 mL/hr over 60 Minutes Intravenous  Once 04/10/24 0106 04/10/24 0236   04/10/24 0115  ciprofloxacin (CIPRO) IVPB 400 mg        400 mg 200 mL/hr over 60 Minutes Intravenous  Once 04/10/24 0106 04/10/24 0236        Assessment/Plan  POD2 s/p laparoscopic cholecystectomy Dr. Ebbie - Pt with persistent pain and nausea this AM - AST/ALT and Tbili all downtrending, MRCP negative - GI following - question whether she just passed a stone. GI to evaluate this AM, but may not need ERCP - NPO for now but ok to have diet if no procedure planned - pain control and prn antiemetics, mobilize, ok to shower - if no ERCP and pt improving symptomatically maybe home later today vs tomorrow   FEN: NPO, LR@75  cc/h VTE: SQH held for possible ERCP ID: Cipro/flagyl pre-op    LOS: 1 day     Burnard JONELLE Louder, [facility] 04/12/2024, 8:45 AM Please see Amion for pager number during day hours 7:00am-4:30pm

## 2024-04-12 NOTE — Plan of Care (Signed)

## 2024-04-12 NOTE — Plan of Care (Addendum)
 Patient calm and cooperative A&O X4, husband at bedside. PRN pain and nausea medications given to treat pain and nausea. Medications tolerated well. Patient able to ambulate to bathroom one person assist. Patient left with call bell in reach, side rails up and bed in lowest position. 0630- Antibiotic medication due on [facility], pharmacy states medication will be given in procedural area via secure chat.   Problem: Education: Goal: Knowledge of General Education information will improve Description: Including pain rating scale, medication(s)/side effects and non-pharmacologic comfort measures Outcome: Progressing   Problem: Health Behavior/Discharge Planning: Goal: Ability to manage health-related needs will improve Outcome: Progressing   Problem: Clinical Measurements: Goal: Ability to maintain clinical measurements within normal limits will improve Outcome: Progressing   Problem: Activity: Goal: Risk for activity intolerance will decrease Outcome: Progressing   Problem: Nutrition: Goal: Adequate nutrition will be maintained Outcome: Progressing   Problem: Coping: Goal: Level of anxiety will decrease Outcome: Progressing   Problem: Elimination: Goal: Will not experience complications related to bowel motility Outcome: Progressing   Problem: Safety: Goal: Ability to remain free from injury will improve Outcome: Progressing   Problem: Skin Integrity: Goal: Risk for impaired skin integrity will decrease Outcome: Progressing

## 2024-04-13 ENCOUNTER — Other Ambulatory Visit: Payer: Self-pay | Admitting: Physician Assistant

## 2024-04-13 LAB — COMPREHENSIVE METABOLIC PANEL WITH GFR
ALT: 231 U/L — ABNORMAL HIGH (ref 0–44)
AST: 61 U/L — ABNORMAL HIGH (ref 15–41)
Albumin: 3.3 g/dL — ABNORMAL LOW (ref 3.5–5.0)
Alkaline Phosphatase: 108 U/L (ref 38–126)
Anion gap: 10 (ref 5–15)
BUN: 6 mg/dL (ref 6–20)
CO2: 21 mmol/L — ABNORMAL LOW (ref 22–32)
Calcium: 8.5 mg/dL — ABNORMAL LOW (ref 8.9–10.3)
Chloride: 105 mmol/L (ref 98–111)
Creatinine, Ser: 0.77 mg/dL (ref 0.44–1.00)
GFR, Estimated: >60 mL/min (ref >60)
Glucose, Bld: 89 mg/dL (ref 70–99)
Potassium: 4.1 mmol/L (ref 3.5–5.1)
Sodium: 136 mmol/L (ref 135–145)
Total Bilirubin: 1.1 mg/dL (ref 0.0–1.2)
Total Protein: 5.7 g/dL — ABNORMAL LOW (ref 6.5–8.1)

## 2024-04-13 MED ORDER — ONDANSETRON HCL 4 MG PO TABS: 4.0000 mg | ORAL_TABLET | Freq: Three times a day (TID) | ORAL | 0 refills | Status: AC | PRN

## 2024-04-13 MED ORDER — OXYCODONE HCL 5 MG PO TABS: 5.0000 mg | ORAL_TABLET | ORAL | 0 refills | Status: AC | PRN

## 2024-04-13 NOTE — Plan of Care (Signed)
 Patient calm and cooperative A&O X4, husband at bedside. PRN pain and nausea medications given to treat pain and nausea. Medications tolerated well. Patient able to ambulate to bathroom stand by assist. Patient left with call bell in reach, side rails up and bed in lowest position.   Problem: Education: Goal: Knowledge of General Education information will improve Description: Including pain rating scale, medication(s)/side effects and non-pharmacologic comfort measures Outcome: Progressing   Problem: Health Behavior/Discharge Planning: Goal: Ability to manage health-related needs will improve Outcome: Progressing   Problem: Activity: Goal: Risk for activity intolerance will decrease Outcome: Progressing   Problem: Nutrition: Goal: Adequate nutrition will be maintained Outcome: Progressing   Problem: Coping: Goal: Level of anxiety will decrease Outcome: Progressing   Problem: Pain Managment: Goal: General experience of comfort will improve and/or be controlled Outcome: Progressing   Problem: Safety: Goal: Ability to remain free from injury will improve Outcome: Progressing

## 2024-04-13 NOTE — Plan of Care (Signed)
  Problem: Education: Goal: Knowledge of General Education information will improve Description: Including pain rating scale, medication(s)/side effects and non-pharmacologic comfort measures Outcome: Progressing   Problem: Health Behavior/Discharge Planning: Goal: Ability to manage health-related needs will improve Outcome: Progressing   Problem: Clinical Measurements: Goal: Ability to maintain clinical measurements within normal limits will improve Outcome: Progressing Goal: Will remain free from infection Outcome: Progressing Goal: Diagnostic test results will improve Outcome: Progressing Goal: Respiratory complications will improve Outcome: Progressing Goal: Cardiovascular complication will be avoided Outcome: Progressing   Problem: Activity: Goal: Risk for activity intolerance will decrease Outcome: Progressing   Problem: Pain Managment: Goal: General experience of comfort will improve and/or be controlled Outcome: Progressing   Problem: Elimination: Goal: Will not experience complications related to bowel motility Outcome: Progressing Goal: Will not experience complications related to urinary retention Outcome: Progressing   Problem: Safety: Goal: Ability to remain free from injury will improve Outcome: Progressing   Problem: Skin Integrity: Goal: Risk for impaired skin integrity will decrease Outcome: Progressing

## 2024-04-13 NOTE — Discharge Summary (Signed)
 [facility] Discharge Summary   Patient ID: Tammy Richard MRN: [id number] DOB/AGE: April 27, 1976 48 y.o.  Admit date: 04/09/2024 Discharge date: 04/13/2024  Admitting Diagnosis: Acute cholecystitis   Discharge Diagnosis Acute cholecystitis  Possible choledocholithiasis   Consultants Gastroenterology   Imaging: MR ABDOMEN MRCP W WO CONTAST Result Date: 04/11/2024 EXAM: MRCP WITHOUT AND WITH IV CONTRAST 04/11/2024 04:17:27 PM TECHNIQUE: Multisequence, multiplanar magnetic resonance images of the abdomen without and with intravenous contrast. MRCP sequences were performed. 6 mL (gadobutrol (GADAVIST) 1 MMOL/ML injection 6 mL GADOBUTROL 1 MMOL/ML IV SOLN). COMPARISON: CT of 1 day prior. CLINICAL HISTORY: Cholelithiasis; POD1 s/p lap chole, concern for choledocholithiasis. FINDINGS: LIMITATIONS: Portions of exam are minimally motion degraded. LIVER: Unremarkable. GALLBLADDER AND BILIARY SYSTEM: Status post cholecystectomy. Minimal edema in the operative bed, without well defined fluid collection. Minimal intrahepatic biliary duct dilatation. Mild common duct dilatation including at 13 mm on image 19 / 3. No obstructive stone or mass identified. On the preoperative CT, the duct measured approximately 12 mm at the same level. SPLEEN: Unremarkable. PANCREAS/PANCREATIC DUCT: Visualized pancreas is unremarkable. No pancreatic ductal dilatation. ADRENAL GLANDS: Unremarkable. KIDNEYS: Markedly atrophic left kidney.  normal right kidney. LYMPH NODES: No enlarged abdominal lymph nodes. VASCULATURE: Aortic atherosclerosis. PERITONEUM: No ascites. ABDOMINAL WALL: No hernia. No mass. BOWEL: The stomach is fluid filled. Normal caliber of bowel loops. No bowel obstruction. BONES: No acute abnormality or worrisome osseous lesion. SOFT TISSUES: Unremarkable. MISCELLANEOUS: Unremarkable. IMPRESSION: 1. status post cholecystectomy, with mild intra and extrahepatic biliary duct dilatation.no obstructive  stone or mass identified. The extent of biliary duct dilatation is relatively similar to preoperative CT. therefore, findings may relate to recent stone passage. if bilirubin remains elevated, ERCP should be considered. 2. No postoperative complication identified. 3. Fluid filled, mildly distended proximal stomach. consider nasogastric tube placement. Electronically signed by: Rockey Kilts MD 04/11/2024 06:25 PM EST RP Workstation: [id number]   MR 3D Recon At Scanner Result Date: 04/11/2024 EXAM: MRCP WITHOUT AND WITH IV CONTRAST 04/11/2024 04:17:27 PM TECHNIQUE: Multisequence, multiplanar magnetic resonance images of the abdomen without and with intravenous contrast. MRCP sequences were performed. 6 mL (gadobutrol (GADAVIST) 1 MMOL/ML injection 6 mL GADOBUTROL 1 MMOL/ML IV SOLN). COMPARISON: CT of 1 day prior. CLINICAL HISTORY: Cholelithiasis; POD1 s/p lap chole, concern for choledocholithiasis. FINDINGS: LIMITATIONS: Portions of exam are minimally motion degraded. LIVER: Unremarkable. GALLBLADDER AND BILIARY SYSTEM: Status post cholecystectomy. Minimal edema in the operative bed, without well defined fluid collection. Minimal intrahepatic biliary duct dilatation. Mild common duct dilatation including at 13 mm on image 19 / 3. No obstructive stone or mass identified. On the preoperative CT, the duct measured approximately 12 mm at the same level. SPLEEN: Unremarkable. PANCREAS/PANCREATIC DUCT: Visualized pancreas is unremarkable. No pancreatic ductal dilatation. ADRENAL GLANDS: Unremarkable. KIDNEYS: Markedly atrophic left kidney.  normal right kidney. LYMPH NODES: No enlarged abdominal lymph nodes. VASCULATURE: Aortic atherosclerosis. PERITONEUM: No ascites. ABDOMINAL WALL: No hernia. No mass. BOWEL: The stomach is fluid filled. Normal caliber of bowel loops. No bowel obstruction. BONES: No acute abnormality or worrisome osseous lesion. SOFT TISSUES: Unremarkable. MISCELLANEOUS: Unremarkable. IMPRESSION: 1.  status post cholecystectomy, with mild intra and extrahepatic biliary duct dilatation.no obstructive stone or mass identified. The extent of biliary duct dilatation is relatively similar to preoperative CT. therefore, findings may relate to recent stone passage. if bilirubin remains elevated, ERCP should be considered. 2. No postoperative complication identified. 3. Fluid filled, mildly distended proximal stomach. consider nasogastric tube placement. Electronically signed by: Rockey Kilts MD  04/11/2024 06:25 PM EST RP Workstation: [id number]    Procedures Dr. Donnice Bury (04/10/24) - Laparoscopic Cholecystectomy   Hospital Course:  Patient is a 48 year old female who presented to the ED with abdominal pain.  Workup showed acute cholecystitis.  Patient was admitted and underwent procedure listed above.  Tolerated procedure well and was transferred to the floor. LFTs were elevated POD1 and MRCP obtained as well as GI consult. MRCP was negative for choledocholithiasis but did show some mild common bile duct dilation. LFTs trended and were trending down, felt that she may have passed a stone.  Diet was advanced as tolerated.  On POD3, the patient was voiding well, tolerating diet, ambulating well, pain well controlled, vital signs stable, incisions c/d/i and felt stable for discharge home.  Patient will follow up in our office in 3-4 weeks and knows to call with questions or concerns.    Physical Exam: General:  Alert, NAD, pleasant, comfortable Abd:  Soft, ND, mild tenderness, incisions C/D/I  I or a member of my team have reviewed this patient in the Controlled Substance Database.   Allergies as of 04/13/2024       Reactions   Codeine    Ibuprofen    Nsaids    Penicillins    Sulfa Antibiotics Rash        Medication List     TAKE these medications    acetaminophen 500 MG tablet Commonly known as: TYLENOL Take 500 mg by mouth as needed for headache.   calcium carbonate 500 MG  chewable tablet Commonly known as: TUMS - dosed in mg elemental calcium Chew 1 tablet by mouth daily. *gummies*   omeprazole 20 MG capsule Commonly known as: PRILOSEC Take 20 mg by mouth daily.   ondansetron 4 MG tablet Commonly known as: Zofran Take 1 tablet (4 mg total) by mouth every 8 (eight) hours as needed for nausea or vomiting.   oxyCODONE 5 MG immediate release tablet Commonly known as: Oxy IR/ROXICODONE Take 1 tablet (5 mg total) by mouth every 4 (four) hours as needed for moderate pain (pain score 4-6).          Follow-up Information     Maczis, Puja Gosai, PA-C Follow up.   Specialty: General Surgery Why: 12/9 at 10:00, Please bring a copy of your photo ID, insurance card and arrive 30 minutes prior to your appointment for paperwork. Contact information: [address] [place] [address] [phone]                 Signed: Burnard JONELLE Louder , [facility] 04/13/2024, 6:57 AM Please see Amion for pager number during day hours 7:00am-4:30pm

## 2024-04-13 NOTE — Plan of Care (Signed)

## 2024-04-13 NOTE — Progress Notes (Signed)
 Subjective: Feeling much better compared to yesterday.  Objective: Vital signs in last 24 hours: Temp:  [98.5 F (36.9 C)-99.4 F (37.4 C)] 99.4 F (37.4 C) (11/15 0434) Pulse Rate:  [80-94] 89 (11/15 0434) Resp:  [16-20] 16 (11/15 0434) BP: (99-126)/(51-76) 104/65 (11/15 0434) SpO2:  [96 %-100 %] 97 % (11/15 0434) Last BM Date : 02/07/24  Intake/Output from previous day: No intake/output data recorded. Intake/Output this shift: No intake/output data recorded.  General appearance: alert and no distress GI: focal tenderness in the RUQ - improved  Lab Results: Recent Labs    04/11/24 0458  WBC 8.6  HGB 12.1  HCT 34.6*  PLT 224   BMET Recent Labs    04/11/24 0458 04/12/24 0452 04/13/24 0522  NA 132* 130* 136  K 4.5 4.2 4.1  CL 100 98 105  CO2 22 24 21*  GLUCOSE 115* 83 89  BUN 8 7 6   CREATININE 0.91 0.84 0.77  CALCIUM 8.8* 8.4* 8.5*   LFT Recent Labs    04/13/24 0522  PROT 5.7*  ALBUMIN 3.3*  AST 61*  ALT 231*  ALKPHOS 108  BILITOT 1.1   PT/INR No results for input(s): LABPROT, INR in the last 72 hours. Hepatitis Panel No results for input(s): HEPBSAG, HCVAB, HEPAIGM, HEPBIGM in the last 72 hours. C-Diff No results for input(s): CDIFFTOX in the last 72 hours. Fecal Lactopherrin No results for input(s): FECLLACTOFRN in the last 72 hours.  Studies/Results: MR ABDOMEN MRCP W WO CONTAST Result Date: 04/11/2024 EXAM: MRCP WITHOUT AND WITH IV CONTRAST 04/11/2024 04:17:27 PM TECHNIQUE: Multisequence, multiplanar magnetic resonance images of the abdomen without and with intravenous contrast. MRCP sequences were performed. 6 mL (gadobutrol (GADAVIST) 1 MMOL/ML injection 6 mL GADOBUTROL 1 MMOL/ML IV SOLN). COMPARISON: CT of 1 day prior. CLINICAL HISTORY: Cholelithiasis; POD1 s/p lap chole, concern for choledocholithiasis. FINDINGS: LIMITATIONS: Portions of exam are minimally motion degraded. LIVER: Unremarkable. GALLBLADDER AND BILIARY SYSTEM:  Status post cholecystectomy. Minimal edema in the operative bed, without well defined fluid collection. Minimal intrahepatic biliary duct dilatation. Mild common duct dilatation including at 13 mm on image 19 / 3. No obstructive stone or mass identified. On the preoperative CT, the duct measured approximately 12 mm at the same level. SPLEEN: Unremarkable. PANCREAS/PANCREATIC DUCT: Visualized pancreas is unremarkable. No pancreatic ductal dilatation. ADRENAL GLANDS: Unremarkable. KIDNEYS: Markedly atrophic left kidney.  normal right kidney. LYMPH NODES: No enlarged abdominal lymph nodes. VASCULATURE: Aortic atherosclerosis. PERITONEUM: No ascites. ABDOMINAL WALL: No hernia. No mass. BOWEL: The stomach is fluid filled. Normal caliber of bowel loops. No bowel obstruction. BONES: No acute abnormality or worrisome osseous lesion. SOFT TISSUES: Unremarkable. MISCELLANEOUS: Unremarkable. IMPRESSION: 1. status post cholecystectomy, with mild intra and extrahepatic biliary duct dilatation.no obstructive stone or mass identified. The extent of biliary duct dilatation is relatively similar to preoperative CT. therefore, findings may relate to recent stone passage. if bilirubin remains elevated, ERCP should be considered. 2. No postoperative complication identified. 3. Fluid filled, mildly distended proximal stomach. consider nasogastric tube placement. Electronically signed by: Rockey Kilts MD 04/11/2024 06:25 PM EST RP Workstation: [id number]   MR 3D Recon At Scanner Result Date: 04/11/2024 EXAM: MRCP WITHOUT AND WITH IV CONTRAST 04/11/2024 04:17:27 PM TECHNIQUE: Multisequence, multiplanar magnetic resonance images of the abdomen without and with intravenous contrast. MRCP sequences were performed. 6 mL (gadobutrol (GADAVIST) 1 MMOL/ML injection 6 mL GADOBUTROL 1 MMOL/ML IV SOLN). COMPARISON: CT of 1 day prior. CLINICAL HISTORY: Cholelithiasis; POD1 s/p lap chole, concern for choledocholithiasis. FINDINGS:  LIMITATIONS:  Portions of exam are minimally motion degraded. LIVER: Unremarkable. GALLBLADDER AND BILIARY SYSTEM: Status post cholecystectomy. Minimal edema in the operative bed, without well defined fluid collection. Minimal intrahepatic biliary duct dilatation. Mild common duct dilatation including at 13 mm on image 19 / 3. No obstructive stone or mass identified. On the preoperative CT, the duct measured approximately 12 mm at the same level. SPLEEN: Unremarkable. PANCREAS/PANCREATIC DUCT: Visualized pancreas is unremarkable. No pancreatic ductal dilatation. ADRENAL GLANDS: Unremarkable. KIDNEYS: Markedly atrophic left kidney.  normal right kidney. LYMPH NODES: No enlarged abdominal lymph nodes. VASCULATURE: Aortic atherosclerosis. PERITONEUM: No ascites. ABDOMINAL WALL: No hernia. No mass. BOWEL: The stomach is fluid filled. Normal caliber of bowel loops. No bowel obstruction. BONES: No acute abnormality or worrisome osseous lesion. SOFT TISSUES: Unremarkable. MISCELLANEOUS: Unremarkable. IMPRESSION: 1. status post cholecystectomy, with mild intra and extrahepatic biliary duct dilatation.no obstructive stone or mass identified. The extent of biliary duct dilatation is relatively similar to preoperative CT. therefore, findings may relate to recent stone passage. if bilirubin remains elevated, ERCP should be considered. 2. No postoperative complication identified. 3. Fluid filled, mildly distended proximal stomach. consider nasogastric tube placement. Electronically signed by: Rockey Kilts MD 04/11/2024 06:25 PM EST RP Workstation: [id number]    Medications: Scheduled:  acetaminophen  1,000 mg Oral Q6H   dicyclomine  20 mg Oral Q8H   Continuous:  ciprofloxacin      Assessment/Plan: 1) Elevated liver enzymes. 2) S/p lap chole.   The patient reports a significant improvement.  Her liver enzymes continue to trend downwards.  The assumption is that she passed a gallstone.  Since she is better clinically, and her  liver enzymes continue to improve, she can be discharged home safely.  Plan: 1) Okay to D/C home. 2) Follow up with [facility] in 1-2 weeks. 3) Follow up with Surgery as recommended. 4) Signing off.  LOS: 2 days   Mary Hockey D 04/13/2024, 7:46 AM

## 2024-04-15 NOTE — Progress Notes (Signed)
 Patient Care Advocate Transitions of Care Note Initial Encounter  Date of Admission: 04/09/24  Date of Discharge: 04/13/24  Initial Contact Date:  04/15/24 First Call:  Unsuccessful Unsuccessful:  Left message First call date:  04/15/2024 Second Call:  Unsuccessful Unsuccessful:  Left message Second call date:  04/18/2024   Telephone contact made with: Pt  Patient recently discharged from: [facility]  Discharge Diagnosis/Pertinent Hospital Course:   Acute cholecystitis      Patient Discharged To: Home w/ self care    Post Discharge Concerns/Needs:  Pt voiced doing well and has fu w/ General Surgery 05/07/24.  Discussed alternatives to the ED: VII. PCP hours of availability IX. Same day and weekend appointments X. Other Urgent Cares in the in the area Can I assist you with scheduling any appointments? (if scheduled document date below) Pt fu w/ specialist- has PCP routine fu 06/07/24 Reviewed When to call the office: XI. Fever XII. Uncontrolled pain XIII. New or worsening symptoms XIV. New questions or concerns   PCP follow-up visit within 7-14 days of discharge: Pt fu w/ General Surgery 05/07/24  A follow up office visit scheduled with:   Date:     Time:    Date of next telephone follow-up: N/A  Clotilda Concha, CMA Patient Care Advocate [facility] [phone]

## 2024-04-18 NOTE — Telephone Encounter (Addendum)
    Patient is calling to let PCP know that she had gall bladder surgery on 04/10/24 with [facility]   States that she's feeling well, some pain but is working out good.   Call back: [phone]

## 2024-04-19 NOTE — Telephone Encounter (Signed)
 FYI- Patient has a pending ov 06/07/24.
